# Patient Record
Sex: Male | Born: 1965
Health system: Southern US, Community
[De-identification: ages and names within clinical notes are randomized; demographics above are authoritative.]

## PROBLEM LIST (undated history)

## (undated) DIAGNOSIS — Z8481 Family history of carrier of genetic disease: Secondary | ICD-10-CM

## (undated) DIAGNOSIS — Z801 Family history of malignant neoplasm of trachea, bronchus and lung: Secondary | ICD-10-CM

## (undated) DIAGNOSIS — I10 Essential (primary) hypertension: Secondary | ICD-10-CM

## (undated) DIAGNOSIS — C801 Malignant (primary) neoplasm, unspecified: Secondary | ICD-10-CM

## (undated) DIAGNOSIS — Z800 Family history of malignant neoplasm of digestive organs: Secondary | ICD-10-CM

## (undated) DIAGNOSIS — Z83719 Family history of colon polyps, unspecified: Secondary | ICD-10-CM

## (undated) DIAGNOSIS — Z803 Family history of malignant neoplasm of breast: Secondary | ICD-10-CM

## (undated) DIAGNOSIS — M5030 Other cervical disc degeneration, unspecified cervical region: Secondary | ICD-10-CM

## (undated) DIAGNOSIS — R000 Tachycardia, unspecified: Secondary | ICD-10-CM

## (undated) DIAGNOSIS — Z8371 Family history of colonic polyps: Secondary | ICD-10-CM

## (undated) DIAGNOSIS — R51 Headache: Secondary | ICD-10-CM

## (undated) DIAGNOSIS — K5790 Diverticulosis of intestine, part unspecified, without perforation or abscess without bleeding: Secondary | ICD-10-CM

## (undated) DIAGNOSIS — Z87442 Personal history of urinary calculi: Secondary | ICD-10-CM

## (undated) DIAGNOSIS — Z860101 Personal history of adenomatous and serrated colon polyps: Secondary | ICD-10-CM

## (undated) DIAGNOSIS — G4730 Sleep apnea, unspecified: Secondary | ICD-10-CM

## (undated) DIAGNOSIS — R21 Rash and other nonspecific skin eruption: Secondary | ICD-10-CM

## (undated) DIAGNOSIS — Z86010 Personal history of colonic polyps: Secondary | ICD-10-CM

## (undated) DIAGNOSIS — K648 Other hemorrhoids: Secondary | ICD-10-CM

## (undated) DIAGNOSIS — T7840XA Allergy, unspecified, initial encounter: Secondary | ICD-10-CM

## (undated) HISTORY — DX: Personal history of colonic polyps: Z86.010

## (undated) HISTORY — DX: Sleep apnea, unspecified: G47.30

## (undated) HISTORY — PX: COLONOSCOPY: SHX174

## (undated) HISTORY — DX: Family history of colon polyps, unspecified: Z83.719

## (undated) HISTORY — DX: Other cervical disc degeneration, unspecified cervical region: M50.30

## (undated) HISTORY — DX: Essential (primary) hypertension: I10

## (undated) HISTORY — DX: Family history of carrier of genetic disease: Z84.81

## (undated) HISTORY — DX: Allergy, unspecified, initial encounter: T78.40XA

## (undated) HISTORY — DX: Malignant (primary) neoplasm, unspecified: C80.1

## (undated) HISTORY — DX: Other hemorrhoids: K64.8

## (undated) HISTORY — DX: Family history of colonic polyps: Z83.71

## (undated) HISTORY — DX: Tachycardia, unspecified: R00.0

## (undated) HISTORY — DX: Family history of malignant neoplasm of breast: Z80.3

## (undated) HISTORY — DX: Personal history of adenomatous and serrated colon polyps: Z86.0101

## (undated) HISTORY — DX: Family history of malignant neoplasm of digestive organs: Z80.0

## (undated) HISTORY — DX: Family history of malignant neoplasm of trachea, bronchus and lung: Z80.1

## (undated) HISTORY — DX: Diverticulosis of intestine, part unspecified, without perforation or abscess without bleeding: K57.90

---

## 2010-07-31 ENCOUNTER — Ambulatory Visit (HOSPITAL_COMMUNITY): Admission: RE | Admit: 2010-07-31 | Discharge: 2010-07-31 | Payer: Self-pay | Admitting: Gastroenterology

## 2010-08-06 ENCOUNTER — Encounter: Admission: RE | Admit: 2010-08-06 | Discharge: 2010-08-06 | Payer: Self-pay | Admitting: Gastroenterology

## 2010-10-30 ENCOUNTER — Encounter: Admission: RE | Admit: 2010-10-30 | Payer: Self-pay | Source: Home / Self Care | Admitting: Internal Medicine

## 2010-10-31 ENCOUNTER — Encounter
Admission: RE | Admit: 2010-10-31 | Discharge: 2010-10-31 | Payer: Self-pay | Source: Home / Self Care | Attending: Internal Medicine | Admitting: Internal Medicine

## 2010-12-19 ENCOUNTER — Other Ambulatory Visit: Payer: Self-pay | Admitting: Internal Medicine

## 2010-12-19 DIAGNOSIS — R319 Hematuria, unspecified: Secondary | ICD-10-CM

## 2010-12-22 ENCOUNTER — Ambulatory Visit
Admission: RE | Admit: 2010-12-22 | Discharge: 2010-12-22 | Disposition: A | Discharge status: Home / Self Care | Payer: Managed Care, Other (non HMO) | Source: Ambulatory Visit | Attending: Internal Medicine | Admitting: Internal Medicine

## 2010-12-22 DIAGNOSIS — R319 Hematuria, unspecified: Secondary | ICD-10-CM

## 2011-03-24 ENCOUNTER — Other Ambulatory Visit: Payer: Self-pay | Admitting: Gastroenterology

## 2011-09-14 ENCOUNTER — Other Ambulatory Visit: Payer: Self-pay | Admitting: Gastroenterology

## 2012-09-11 ENCOUNTER — Ambulatory Visit (INDEPENDENT_AMBULATORY_CARE_PROVIDER_SITE_OTHER): Payer: Managed Care, Other (non HMO) | Admitting: Family Medicine

## 2012-09-11 VITALS — BP 137/88 | HR 97 | Temp 98.4°F | Resp 16 | Ht 69.0 in | Wt 214.0 lb

## 2012-09-11 DIAGNOSIS — Z0000 Encounter for general adult medical examination without abnormal findings: Secondary | ICD-10-CM

## 2012-09-11 DIAGNOSIS — Z87891 Personal history of nicotine dependence: Secondary | ICD-10-CM

## 2012-09-11 DIAGNOSIS — Z801 Family history of malignant neoplasm of trachea, bronchus and lung: Secondary | ICD-10-CM

## 2012-09-11 NOTE — Progress Notes (Signed)
Subjective:    Patient ID: Jon King, male    DOB: [birth year], 46 y.o.   MRN: [id number]  HPI Jon King is a 46 y.o. male Here for CPE, DOT physical.   Flu shot 1 month ago. Last td in 2012.  Gabapentin for nerve pain in neck for past 2 years.  No side effects. No sedation, lightheadedness or dizziness with this medicine, no recent dose changes.   Usually followed by Dr. Nehemiah Settle.  Normal prostate exam last year.  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   Father and older sister with Lung cancer - both smokers, father deceased at 9yo.  Patient smoked 1ppd for approx 18years, quit 11 years ago.  No personal cough, night sweats or unexplained weight loss. Unknown last cxr.   Colonoscopy - 2 years ago for blood in stool - benign.  Repeat colonoscopy last year - normal.  Plan to repeat in 3 years.   Fasting today.   Review of Systems Agree with CMA note - no positive responses - 13pt ros.     Objective:   Physical Exam  Constitutional: He is oriented to person, place, and time. He appears well-developed and well-nourished.  HENT:  Head: Normocephalic and atraumatic.  Right Ear: External ear normal.  Left Ear: External ear normal.  Mouth/Throat: Oropharynx is clear and moist.  Eyes: Conjunctivae normal and EOM are normal. Pupils are equal, round, and reactive to light.  Neck: Normal range of motion. Neck supple. No thyromegaly present.  Cardiovascular: Normal rate, regular rhythm, normal heart sounds and intact distal pulses.   Pulmonary/Chest: Effort normal and breath sounds normal. No respiratory distress. He has no wheezes.  Abdominal: Soft. He exhibits no distension. There is no tenderness. Hernia confirmed negative in the right inguinal area and confirmed negative in the left inguinal area.  Musculoskeletal: Normal range of motion. He exhibits no edema and no tenderness.    Lymphadenopathy:    He has no cervical adenopathy.  Neurological: He is alert and oriented to person, place, and time. He has normal reflexes.  Skin: Skin is warm and dry.  Psychiatric: He has a normal mood and affect. His behavior is normal.        Assessment & Plan:  Jon King is a 46 y.o. male 1. Annual physical exam   2. Family history of lung cancer   3. History of tobacco use     CPE - declined labs - plans on follow up with Dr. Nehemiah Settle next year - 1st of year. Up to date on td and flu vaccine.   Dot pe - 2 year card.  See ppwk.  Trace hematuria - to follow up with Dr. Nehemiah Settle.   Discussed prostate testing - declined.   Discussed CXR given FH lung CA and prior smoker - 18pack year hx.  Declined today - will follow up with Dr. Nehemiah Settle.  Patient Instructions  Your screening urine test did pick up a trace of possible blood.  Follow up with your primary care doctor to recheck a urine test, and discuss this further.  Also follow up for other discussion on labs or necessary x rays.  Return to the clinic or go to the nearest emergency room if any of your symptoms worsen or new symptoms occur.  Keeping you healthy  Get these tests  Blood pressure- Have your blood pressure checked once a year  by your healthcare provider.  Normal blood pressure is 120/80.  Weight- Have your body mass index (BMI) calculated to screen for obesity.  BMI is a measure of body fat based on height and weight. You can also calculate your own BMI at https://www.west-esparza.com/.  Cholesterol- Have your cholesterol checked regularly starting at age 23, sooner may be necessary if you have diabetes, high blood pressure, if a family member developed heart diseases at an early age or if you smoke.   Chlamydia, HIV, and other sexual transmitted disease- Get screened each year until the age of 18 then within three months of each new sexual partner.  Diabetes- Have your blood sugar checked regularly if you have  high blood pressure, high cholesterol, a family history of diabetes or if you are overweight.  Get these vaccines  Flu shot- Every fall.  Tetanus shot- Every 10 years.  Menactra- Single dose; prevents meningitis.  Take these steps  Don't smoke- If you do smoke, ask your healthcare provider about quitting. For tips on how to quit, go to www.smokefree.gov or call 1-800-QUIT-NOW.  Be physically active- Exercise 5 days a week for at least 30 minutes.  If you are not already physically active start slow and gradually work up to 30 minutes of moderate physical activity.  Examples of moderate activity include walking briskly, mowing the yard, dancing, swimming bicycling, etc.  Eat a healthy diet- Eat a variety of healthy foods such as fruits, vegetables, low fat milk, low fat cheese, yogurt, lean meats, poultry, fish, beans, tofu, etc.  For more information on healthy eating, go to www.thenutritionsource.org  Drink alcohol in moderation- Limit alcohol intake two drinks or less a day.  Never drink and drive.  Dentist- Brush and floss teeth twice daily; visit your dentis twice a year.  Depression-Your emotional health is as important as your physical health.  If you're feeling down, losing interest in things you normally enjoy please talk with your healthcare provider.  Gun Safety- If you keep a gun in your home, keep it unloaded and with the safety lock on.  Bullets should be stored separately.  Helmet use- Always wear a helmet when riding a motorcycle, bicycle, rollerblading or skateboarding.  Safe sex- If you may be exposed to a sexually transmitted infection, use a condom  Seat belts- Seat bels can save your life; always wear one.  Smoke/Carbon Monoxide detectors- These detectors need to be installed on the appropriate level of your home.  Replace batteries at least once a year.  Skin Cancer- When out in the sun, cover up and use sunscreen SPF 15 or higher.  Violence- If anyone is  threatening or hurting you, please tell your healthcare provider.

## 2012-09-11 NOTE — Patient Instructions (Signed)
Your screening urine test did pick up a trace of possible blood.  Follow up with your primary care doctor to recheck a urine test, and discuss this further.  Also follow up for other discussion on labs or necessary x rays.  Return to the clinic or go to the nearest emergency room if any of your symptoms worsen or new symptoms occur.  Keeping you healthy  Get these tests  Blood pressure- Have your blood pressure checked once a year by your healthcare provider.  Normal blood pressure is 120/80.  Weight- Have your body mass index (BMI) calculated to screen for obesity.  BMI is a measure of body fat based on height and weight. You can also calculate your own BMI at https://www.west-esparza.com/.  Cholesterol- Have your cholesterol checked regularly starting at age 1, sooner may be necessary if you have diabetes, high blood pressure, if a family member developed heart diseases at an early age or if you smoke.   Chlamydia, HIV, and other sexual transmitted disease- Get screened each year until the age of 30 then within three months of each new sexual partner.  Diabetes- Have your blood sugar checked regularly if you have high blood pressure, high cholesterol, a family history of diabetes or if you are overweight.  Get these vaccines  Flu shot- Every fall.  Tetanus shot- Every 10 years.  Menactra- Single dose; prevents meningitis.  Take these steps  Don't smoke- If you do smoke, ask your healthcare provider about quitting. For tips on how to quit, go to www.smokefree.gov or call 1-800-QUIT-NOW.  Be physically active- Exercise 5 days a week for at least 30 minutes.  If you are not already physically active start slow and gradually work up to 30 minutes of moderate physical activity.  Examples of moderate activity include walking briskly, mowing the yard, dancing, swimming bicycling, etc.  Eat a healthy diet- Eat a variety of healthy foods such as fruits, vegetables, low fat milk, low fat cheese,  yogurt, lean meats, poultry, fish, beans, tofu, etc.  For more information on healthy eating, go to www.thenutritionsource.org  Drink alcohol in moderation- Limit alcohol intake two drinks or less a day.  Never drink and drive.  Dentist- Brush and floss teeth twice daily; visit your dentis twice a year.  Depression-Your emotional health is as important as your physical health.  If you're feeling down, losing interest in things you normally enjoy please talk with your healthcare provider.  Gun Safety- If you keep a gun in your home, keep it unloaded and with the safety lock on.  Bullets should be stored separately.  Helmet use- Always wear a helmet when riding a motorcycle, bicycle, rollerblading or skateboarding.  Safe sex- If you may be exposed to a sexually transmitted infection, use a condom  Seat belts- Seat bels can save your life; always wear one.  Smoke/Carbon Monoxide detectors- These detectors need to be installed on the appropriate level of your home.  Replace batteries at least once a year.  Skin Cancer- When out in the sun, cover up and use sunscreen SPF 15 or higher.  Violence- If anyone is threatening or hurting you, please tell your healthcare provider.

## 2012-10-26 HISTORY — PX: LITHOTRIPSY: SUR834

## 2013-03-15 ENCOUNTER — Telehealth: Payer: Self-pay | Admitting: Diagnostic Neuroimaging

## 2013-03-15 ENCOUNTER — Other Ambulatory Visit: Payer: Self-pay | Admitting: Nurse Practitioner

## 2013-03-15 NOTE — Telephone Encounter (Signed)
Patient has not been seen in over 1 year.  He will need to schedule an appt.  As well, we need to verify what Pharmacy he uses.  I called back.  Got no answer.  Left message.

## 2013-05-02 ENCOUNTER — Other Ambulatory Visit: Payer: Self-pay | Admitting: Internal Medicine

## 2013-05-02 ENCOUNTER — Inpatient Hospital Stay: Admission: RE | Admit: 2013-05-02 | Payer: Managed Care, Other (non HMO) | Source: Ambulatory Visit

## 2013-05-02 DIAGNOSIS — R109 Unspecified abdominal pain: Secondary | ICD-10-CM

## 2013-05-03 ENCOUNTER — Ambulatory Visit
Admission: RE | Admit: 2013-05-03 | Discharge: 2013-05-03 | Disposition: A | Discharge status: Home / Self Care | Payer: Managed Care, Other (non HMO) | Source: Ambulatory Visit | Attending: Internal Medicine | Admitting: Internal Medicine

## 2013-05-03 DIAGNOSIS — R109 Unspecified abdominal pain: Secondary | ICD-10-CM

## 2013-05-03 MED ORDER — IOHEXOL 300 MG/ML  SOLN
125.0000 mL | Freq: Once | INTRAMUSCULAR | Status: AC | PRN
  Administered 2013-05-03: 125 mL via INTRAVENOUS

## 2013-05-04 ENCOUNTER — Encounter (HOSPITAL_COMMUNITY): Payer: Self-pay | Admitting: *Deleted

## 2013-05-04 ENCOUNTER — Other Ambulatory Visit: Payer: Self-pay | Admitting: Urology

## 2013-05-08 ENCOUNTER — Encounter (HOSPITAL_COMMUNITY): Payer: Self-pay | Admitting: General Practice

## 2013-05-08 ENCOUNTER — Ambulatory Visit (HOSPITAL_COMMUNITY)
Admission: RE | Admit: 2013-05-08 | Discharge: 2013-05-08 | Disposition: A | Discharge status: Home / Self Care | Payer: Managed Care, Other (non HMO) | Source: Ambulatory Visit | Attending: Urology | Admitting: Urology

## 2013-05-08 ENCOUNTER — Encounter (HOSPITAL_COMMUNITY)
Admission: RE | Disposition: A | Discharge status: Home / Self Care | Payer: Self-pay | Source: Ambulatory Visit | Attending: Urology

## 2013-05-08 ENCOUNTER — Encounter (HOSPITAL_COMMUNITY): Payer: Self-pay | Admitting: Pharmacy Technician

## 2013-05-08 ENCOUNTER — Ambulatory Visit (HOSPITAL_COMMUNITY): Payer: Managed Care, Other (non HMO)

## 2013-05-08 DIAGNOSIS — N201 Calculus of ureter: Secondary | ICD-10-CM | POA: Insufficient documentation

## 2013-05-08 HISTORY — DX: Personal history of urinary calculi: Z87.442

## 2013-05-08 HISTORY — DX: Headache: R51

## 2013-05-08 SURGERY — LITHOTRIPSY, ESWL
Anesthesia: LOCAL | Laterality: Right

## 2013-05-08 MED ORDER — LEVOFLOXACIN 500 MG PO TABS
500.0000 mg | ORAL_TABLET | ORAL | Status: AC
  Administered 2013-05-08: 500 mg via ORAL
  Filled 2013-05-08: qty 1

## 2013-05-08 MED ORDER — DIAZEPAM 5 MG PO TABS
10.0000 mg | ORAL_TABLET | ORAL | Status: AC
  Administered 2013-05-08: 10 mg via ORAL
  Filled 2013-05-08: qty 2

## 2013-05-08 MED ORDER — DIPHENHYDRAMINE HCL 25 MG PO CAPS
25.0000 mg | ORAL_CAPSULE | ORAL | Status: AC
  Administered 2013-05-08: 25 mg via ORAL
  Filled 2013-05-08: qty 1

## 2013-05-08 MED ORDER — DEXTROSE-NACL 5-0.45 % IV SOLN
INTRAVENOUS | Status: DC
  Administered 2013-05-08: 125 mL/h via INTRAVENOUS

## 2013-05-08 NOTE — H&P (Signed)
  Urology History and Physical Exam  CC: right kidney stone  HPI: 47 year old male presents at this time for lithotripsy for a proximal ureteral stone on the right. He presented last week with significant pain. He has been managed with narcotics since that time. His procedure would have been done last week, but he had been on anti-inflammatories.  PMH: Past Medical History  Diagnosis Date  . Degenerative disc disease, cervical   . History of kidney stones   . Headache(784.0)     orginate from nerve thing in head    PSH: Past Surgical History  Procedure Laterality Date  . Colonoscopy  2010, 2011    Allergies: No Known Allergies  Medications: Prescriptions prior to admission  Medication Sig Dispense Refill  . gabapentin (NEURONTIN) 300 MG capsule Take 300 mg by mouth 2 (two) times daily.      Marland Kitchen ibuprofen (ADVIL,MOTRIN) 200 MG tablet Take 200 mg by mouth every 6 (six) hours as needed for pain.      Marland Kitchen oxyCODONE-acetaminophen (PERCOCET/ROXICET) 5-325 MG per tablet Take 1 tablet by mouth every 6 (six) hours as needed for pain.         Social History: History   Social History  . Marital Status: Married    Spouse Name: N/A    Number of Children: N/A  . Years of Education: N/A   Occupational History  . Not on file.   Social History Main Topics  . Smoking status: Former Smoker    Quit date: 09/11/2001  . Smokeless tobacco: Not on file  . Alcohol Use: No  . Drug Use: No  . Sexually Active: Yes   Other Topics Concern  . Not on file   Social History Narrative  . No narrative on file    Family History: Family History  Problem Relation Age of Onset  . Lung cancer Father   . Lung cancer Sister   . Asthma Paternal Grandfather     Review of Systems: Genitourinary, constitutional, skin, eye, otolaryngeal, hematologic/lymphatic, cardiovascular, pulmonary, endocrine, musculoskeletal, gastrointestinal, neurological and psychiatric system(s) were reviewed and pertinent  findings if present are noted.  Genitourinary: urinary frequency, nocturia, difficulty starting the urinary stream, hematuria and erectile dysfunction.  Gastrointestinal: nausea.  Physical Exam: @VITALS2 @ General: No acute distress.  Awake. Head:  Normocephalic.  Atraumatic. ENT:  EOMI.  Mucous membranes moist Neck:  Supple.  No lymphadenopathy. CV:  S1 present. S2 present. Regular rate. Pulmonary: Equal effort bilaterally.  Clear to auscultation bilaterally. Abdomen: Soft.  Non tender to palpation. Skin:  Normal turgor.  No visible rash. Extremity: No gross deformity of bilateral upper extremities.  No gross deformity of    bilateral lower extremities. Neurologic: Alert. Appropriate mood.  n.  Studies:  No results found for this basename: HGB, WBC, PLT,  in the last 72 hours  No results found for this basename: NA, K, CL, CO2, BUN, CREATININE, CALCIUM, MAGNESIUM, GFRNONAA, GFRAA,  in the last 72 hours   No results found for this basename: PT, INR, APTT,  in the last 72 hours   No components found with this basename: ABG,     Assessment:  6 x 9 mm proximal right ureteral stone  Plan: ESL of proximal right ureteral stone

## 2013-07-10 ENCOUNTER — Telehealth: Payer: Self-pay | Admitting: Diagnostic Neuroimaging

## 2013-07-10 MED ORDER — GABAPENTIN 300 MG PO CAPS: 300.0000 mg | ORAL_CAPSULE | Freq: Three times a day (TID) | ORAL | Status: DC

## 2013-07-10 NOTE — Telephone Encounter (Signed)
Patient has scheduled appt.  Rx sent.

## 2013-07-12 ENCOUNTER — Encounter: Payer: Self-pay | Admitting: Nurse Practitioner

## 2013-07-12 ENCOUNTER — Ambulatory Visit (INDEPENDENT_AMBULATORY_CARE_PROVIDER_SITE_OTHER): Payer: Managed Care, Other (non HMO) | Admitting: Nurse Practitioner

## 2013-07-12 VITALS — BP 137/92 | HR 91 | Ht 71.0 in | Wt 219.0 lb

## 2013-07-12 DIAGNOSIS — R51 Headache: Secondary | ICD-10-CM

## 2013-07-12 DIAGNOSIS — R519 Headache, unspecified: Secondary | ICD-10-CM | POA: Insufficient documentation

## 2013-07-12 DIAGNOSIS — Z87442 Personal history of urinary calculi: Secondary | ICD-10-CM | POA: Insufficient documentation

## 2013-07-12 DIAGNOSIS — M542 Cervicalgia: Secondary | ICD-10-CM | POA: Insufficient documentation

## 2013-07-12 MED ORDER — GABAPENTIN 300 MG PO CAPS: 300.0000 mg | ORAL_CAPSULE | Freq: Three times a day (TID) | ORAL | Status: DC

## 2013-07-12 NOTE — Progress Notes (Signed)
[facility]  PATIENT: Jon King DOB: 10/02/1966   REASON FOR VISIT: Followup for occipital neuralgia, cervicogenic headache.    HISTORY OF PRESENT ILLNESS:47 -year-old right-handed male with no significant past medical history, initially evaluated by Dr. Marjory Lies 03/17/11  for evaluation of neck pain, headache and dizziness. He remains on  Gabapentin and has responded well. These episodes have decreased in number and intensity. He had complained of numbness intermittently in the 4th and 5th fingers on the left. Nerve conduction study was normal. No new neurological complaints.   HISTORY: In Sept 2011, the patient developed right lower back pain radiating to his right lower abdomen. He also developed intermittent blood in his stool. He was also developing sweaty sensation, dizziness and lightheadedness. He was diagnosed with colon polyps and had 11 polyps resected. Ever since that time he has had continued episodes of intermittent neck pain, sweating sensation, ear pressure and headache. These fluctuate throughout the week with 1-2 exacerbations per week. He also reports left scalp sensitivity. He felt itching sensation in his ears. He was evaluated by ENT who found no specific ear patholgy. He denies nausea, vomiting, photophobia, phonophobia, vision changes.   REVIEW OF SYSTEMS: Full 14 system review of systems performed and notable only for:  Constitutional: N/A  Cardiovascular: N/A  Ear/Nose/Throat: N/A  Skin: N/A  Eyes: N/A  Respiratory: N/A  Gastroitestinal: N/A  Hematology/Lymphatic: N/A  Endocrine: N/A Musculoskeletal:N/A  Allergy/Immunology: N/A  Neurological: N/A Psychiatric: N/A   ALLERGIES: No Known Allergies  HOME MEDICATIONS: Outpatient Prescriptions Prior to Visit  Medication Sig Dispense Refill  . gabapentin (NEURONTIN) 300 MG capsule Take 1 capsule (300 mg total) by mouth 3 (three) times daily.  270 capsule  0  . ibuprofen (ADVIL,MOTRIN)  200 MG tablet Take 200 mg by mouth every 6 (six) hours as needed for pain.      Marland Kitchen oxyCODONE-acetaminophen (PERCOCET/ROXICET) 5-325 MG per tablet Take 1 tablet by mouth every 6 (six) hours as needed for pain.       No facility-administered medications prior to visit.    PAST MEDICAL HISTORY: Past Medical History  Diagnosis Date  . Degenerative disc disease, cervical   . History of kidney stones   . Headache(784.0)     Left scalp tenderness    PAST SURGICAL HISTORY: Past Surgical History  Procedure Laterality Date  . Colonoscopy  2010, 2011    FAMILY HISTORY: Family History  Problem Relation Age of Onset  . Lung cancer Father   . Lung cancer Sister   . Asthma Paternal Grandfather     SOCIAL HISTORY: History   Social History  . Marital Status: Married    Spouse Name: Cordelia Pen    Number of Children: 2  . Years of Education: 12   Occupational History  .      [employer].   Social History Main Topics  . Smoking status: Former Smoker    Quit date: 09/11/2001  . Smokeless tobacco: Never Used  . Alcohol Use: No  . Drug Use: No  . Sexual Activity: Yes   Other Topics Concern  . Not on file   Social History Narrative   Patient lives at home with his wife Cordelia Pen). Patient works full time. 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 Patient has high school education.   Right handed.   Caffeine- sodas - two daily.     PHYSICAL EXAM  Filed Vitals:   07/12/13 1321  BP: 137/92  Pulse: 91  Height: 5\' 11"  (1.803 m)  Weight: 219 lb (99.338 kg)   Body mass index is 30.56 kg/(m^2).  Generalized: Well developed, in no acute distress  Head: normocephalic and atraumatic,. Oropharynx benign  Neck: Supple, no carotid bruits  Cardiac: Regular rate rhythm, no murmur    Neurological examination   Mentation: Alert oriented to time, place, history taking. Follows all commands speech and language fluent  Cranial nerve II-XII: Pupils were equal round reactive to light  extraocular movements were full, visual field were full on confrontational test. Facial sensation and strength were normal. hearing was intact to finger rubbing bilaterally. Uvula tongue midline. head turning and shoulder shrug and were normal and symmetric.Tongue protrusion into cheek strength was normal. Motor: normal bulk and tone, full strength in the BUE, BLE, fine finger movements normal, no pronator drift. No focal weakness Sensory: normal and symmetric to light touch, pinprick, and  vibration  Coordination: finger-nose-finger, heel-to-shin bilaterally, no dysmetria Reflexes: Brachioradialis 2/2, biceps 2/2, triceps 2/2, patellar 2/2, Achilles 2/2, plantar responses were flexor bilaterally. Gait and Station: Rising up from seated position without assistance, normal stance, without trunk ataxia, moderate stride, good arm swing, smooth turning, able to perform tiptoe, and heel walking without difficulty.   DIAGNOSTIC DATA (LABS, IMAGING, TESTING) -None to review  ASSESSMENT AND PLAN  47 y.o. year old male  has a past medical history of Degenerative disc disease, cervical; History of kidney stones; and Headache(784.0). here for routine followup.  Continue gabapentin at current dose will refill Use Claritin or Allegra for allergies Followup yearly and when necessary Nilda Riggs, [facility], [facility], APRN  [facility] [address] [place], [place] [address] [phone]

## 2013-07-12 NOTE — Patient Instructions (Addendum)
Continue gabapentin at current dose will refill Use Claritin or Allegra for allergies Followup yearly and when necessary

## 2013-08-14 NOTE — Progress Notes (Signed)
I reviewed note and agree with plan.   Suanne Marker, MD 08/14/2013, 10:53 PM Certified in Neurology, Neurophysiology and Neuroimaging  [facility] [address], [address] [place], [place] [address] [phone]

## 2014-01-08 ENCOUNTER — Telehealth: Payer: Self-pay | Admitting: Nurse Practitioner

## 2014-01-08 MED ORDER — GABAPENTIN 300 MG PO CAPS: 300.0000 mg | ORAL_CAPSULE | Freq: Three times a day (TID) | ORAL | Status: DC

## 2014-01-08 NOTE — Telephone Encounter (Signed)
Rx has been sent.  I called the patient.  He is aware.  

## 2014-01-08 NOTE — Telephone Encounter (Signed)
Pt's wife Judeen Hammans called pt wants to know if he can get a refill on his gabapentin (NEURONTIN) 300 MG capsule. Pt has new insurance and would like for prescriptions request sent to [facility] on [address]. Pt would like for someone to c/b once this has been called into pharmacy.

## 2014-07-12 ENCOUNTER — Ambulatory Visit: Payer: Managed Care, Other (non HMO) | Admitting: Nurse Practitioner

## 2015-02-07 ENCOUNTER — Other Ambulatory Visit: Payer: Self-pay | Admitting: Gastroenterology

## 2015-04-26 ENCOUNTER — Other Ambulatory Visit: Payer: Self-pay | Admitting: Gastroenterology

## 2015-05-09 ENCOUNTER — Encounter (HOSPITAL_COMMUNITY): Payer: Self-pay | Admitting: *Deleted

## 2015-05-09 ENCOUNTER — Other Ambulatory Visit: Payer: Self-pay | Admitting: Gastroenterology

## 2015-05-12 NOTE — Anesthesia Preprocedure Evaluation (Addendum)
Anesthesia Evaluation  Patient identified by MRN, date of birth, ID band Patient awake    Reviewed: Allergy & Precautions, NPO status , Patient's Chart, lab work & pertinent test results  History of Anesthesia Complications Negative for: history of anesthetic complications  Airway Mallampati: II  TM Distance: >3 FB Neck ROM: Full    Dental no notable dental hx. (+) Dental Advisory Given   Pulmonary former smoker,  breath sounds clear to auscultation  Pulmonary exam normal       Cardiovascular negative cardio ROS Normal cardiovascular examRhythm:Regular Rate:Normal     Neuro/Psych  Headaches, negative psych ROS   GI/Hepatic negative GI ROS, Neg liver ROS,   Endo/Other  obesity  Renal/GU negative Renal ROS  negative genitourinary   Musculoskeletal  (+) Arthritis -, Osteoarthritis,    Abdominal   Peds negative pediatric ROS (+)  Hematology negative hematology ROS (+)   Anesthesia Other Findings   Reproductive/Obstetrics negative OB ROS                            Anesthesia Physical Anesthesia Plan  ASA: II  Anesthesia Plan: MAC   Post-op Pain Management:    Induction: Intravenous  Airway Management Planned: Nasal Cannula  Additional Equipment:   Intra-op Plan:   Post-operative Plan:   Informed Consent: I have reviewed the patients History and Physical, chart, labs and discussed the procedure including the risks, benefits and alternatives for the proposed anesthesia with the patient or authorized representative who has indicated his/her understanding and acceptance.   Dental advisory given  Plan Discussed with: CRNA  Anesthesia Plan Comments:         Anesthesia Quick Evaluation

## 2015-05-13 ENCOUNTER — Encounter (HOSPITAL_COMMUNITY)
Admission: RE | Disposition: A | Discharge status: Home / Self Care | Payer: Self-pay | Source: Ambulatory Visit | Attending: Gastroenterology

## 2015-05-13 ENCOUNTER — Ambulatory Visit (HOSPITAL_COMMUNITY): Payer: BLUE CROSS/BLUE SHIELD | Admitting: Anesthesiology

## 2015-05-13 ENCOUNTER — Encounter (HOSPITAL_COMMUNITY): Payer: Self-pay

## 2015-05-13 ENCOUNTER — Ambulatory Visit (HOSPITAL_COMMUNITY)
Admission: RE | Admit: 2015-05-13 | Discharge: 2015-05-13 | Disposition: A | Discharge status: Home / Self Care | Payer: BLUE CROSS/BLUE SHIELD | Source: Ambulatory Visit | Attending: Gastroenterology | Admitting: Gastroenterology

## 2015-05-13 DIAGNOSIS — Z87891 Personal history of nicotine dependence: Secondary | ICD-10-CM | POA: Insufficient documentation

## 2015-05-13 DIAGNOSIS — M1990 Unspecified osteoarthritis, unspecified site: Secondary | ICD-10-CM | POA: Insufficient documentation

## 2015-05-13 DIAGNOSIS — D123 Benign neoplasm of transverse colon: Secondary | ICD-10-CM | POA: Insufficient documentation

## 2015-05-13 DIAGNOSIS — K635 Polyp of colon: Secondary | ICD-10-CM | POA: Diagnosis not present

## 2015-05-13 DIAGNOSIS — J309 Allergic rhinitis, unspecified: Secondary | ICD-10-CM | POA: Diagnosis not present

## 2015-05-13 DIAGNOSIS — Z87442 Personal history of urinary calculi: Secondary | ICD-10-CM | POA: Diagnosis not present

## 2015-05-13 DIAGNOSIS — Z09 Encounter for follow-up examination after completed treatment for conditions other than malignant neoplasm: Secondary | ICD-10-CM | POA: Insufficient documentation

## 2015-05-13 DIAGNOSIS — R51 Headache: Secondary | ICD-10-CM | POA: Insufficient documentation

## 2015-05-13 HISTORY — PX: COLONOSCOPY WITH PROPOFOL: SHX5780

## 2015-05-13 HISTORY — DX: Rash and other nonspecific skin eruption: R21

## 2015-05-13 SURGERY — COLONOSCOPY WITH PROPOFOL
Anesthesia: Monitor Anesthesia Care

## 2015-05-13 MED ORDER — ONDANSETRON HCL 4 MG/2ML IJ SOLN
INTRAMUSCULAR | Status: DC | PRN
  Administered 2015-05-13: 4 mg via INTRAVENOUS

## 2015-05-13 MED ORDER — LIDOCAINE HCL (CARDIAC) 20 MG/ML IV SOLN
INTRAVENOUS | Status: AC
  Filled 2015-05-13: qty 5

## 2015-05-13 MED ORDER — PROPOFOL 10 MG/ML IV BOLUS
INTRAVENOUS | Status: AC
Start: 2015-05-13 — End: 2015-05-13
  Filled 2015-05-13: qty 20

## 2015-05-13 MED ORDER — SODIUM CHLORIDE 0.9 % IV SOLN: INTRAVENOUS | Status: DC

## 2015-05-13 MED ORDER — LIDOCAINE HCL (CARDIAC) 20 MG/ML IV SOLN
INTRAVENOUS | Status: DC | PRN
  Administered 2015-05-13: 100 mg via INTRAVENOUS

## 2015-05-13 MED ORDER — LACTATED RINGERS IV SOLN
INTRAVENOUS | Status: DC
Start: 2015-05-13 — End: 2015-05-13
  Administered 2015-05-13 (×2): via INTRAVENOUS

## 2015-05-13 MED ORDER — PROPOFOL 10 MG/ML IV BOLUS
INTRAVENOUS | Status: AC
  Filled 2015-05-13: qty 20

## 2015-05-13 MED ORDER — ONDANSETRON HCL 4 MG/2ML IJ SOLN
INTRAMUSCULAR | Status: AC
  Filled 2015-05-13: qty 2

## 2015-05-13 MED ORDER — PROPOFOL 10 MG/ML IV BOLUS
INTRAVENOUS | Status: DC | PRN
  Administered 2015-05-13 (×2): 20 mg via INTRAVENOUS
  Administered 2015-05-13: 10 mg via INTRAVENOUS

## 2015-05-13 MED ORDER — PROPOFOL INFUSION 10 MG/ML OPTIME
INTRAVENOUS | Status: DC | PRN
  Administered 2015-05-13: 160 ug/kg/min via INTRAVENOUS

## 2015-05-13 SURGICAL SUPPLY — 21 items

## 2015-05-13 NOTE — Discharge Instructions (Signed)
Colonoscopy, Care After °These instructions give you information on caring for yourself after your procedure. Your doctor may also give you more specific instructions. Call your doctor if you have any problems or questions after your procedure. °HOME CARE °· Do not drive for 24 hours. °· Do not sign important papers or use machinery for 24 hours. °· You may shower. °· You may go back to your usual activities, but go slower for the first 24 hours. °· Take rest breaks often during the first 24 hours. °· Walk around or use warm packs on your belly (abdomen) if you have belly cramping or gas. °· Drink enough fluids to keep your pee (urine) clear or pale yellow. °· Resume your normal diet. Avoid heavy or fried foods. °· Avoid drinking alcohol for 24 hours or as told by your doctor. °· Only take medicines as told by your doctor. °If a tissue sample (biopsy) was taken during the procedure:  °· Do not take aspirin or blood thinners for 7 days, or as told by your doctor. °· Do not drink alcohol for 7 days, or as told by your doctor. °· Eat soft foods for the first 24 hours. °GET HELP IF: °You still have a small amount of blood in your poop (stool) 2-3 days after the procedure. °GET HELP RIGHT AWAY IF: °· You have more than a small amount of blood in your poop. °· You see clumps of tissue (blood clots) in your poop. °· Your belly is puffy (swollen). °· You feel sick to your stomach (nauseous) or throw up (vomit). °· You have a fever. °· You have belly pain that gets worse and medicine does not help. °MAKE SURE YOU: °· Understand these instructions. °· Will watch your condition. °· Will get help right away if you are not doing well or get worse. °Document Released: 11/14/2010 Document Revised: 10/17/2013 Document Reviewed: 06/19/2013 °ExitCare® Patient Information ©2015 [employer]. This information is not intended to replace advice given to you by your health care provider. Make sure you discuss any questions you have with  your health care provider. ° °

## 2015-05-13 NOTE — Anesthesia Postprocedure Evaluation (Signed)
  Anesthesia Post-op Note  Patient: Jon King  Procedure(s) Performed: Procedure(s) (LRB): COLONOSCOPY WITH PROPOFOL (N/A)  Patient Location: PACU  Anesthesia Type: MAC  Level of Consciousness: awake and alert   Airway and Oxygen Therapy: Patient Spontanous Breathing  Post-op Pain: mild  Post-op Assessment: Post-op Vital signs reviewed, Patient's Cardiovascular Status Stable, Respiratory Function Stable, Patent Airway and No signs of Nausea or vomiting  Last Vitals:  Filed Vitals:   05/13/15 1040  BP: 120/79  Pulse: 88  Temp:   Resp: 14    Post-op Vital Signs: stable   Complications: No apparent anesthesia complications

## 2015-05-13 NOTE — H&P (Signed)
  Procedure: Surveillance colonoscopy. Adenomatous colon polyps removed colonoscopically in the past.  History: The patient is a 49 year old male born 1966-07-26. He is scheduled to undergo a surveillance colonoscopy today.  Medication allergies: None  Past medical history: Tonsillectomy. Chronic headaches. Allergic rhinitis.  Exam: The patient is alert and lying comfortably on the endoscopy stretcher. Abdomen is soft and nontender to palpation. Lungs are clear to auscultation. Cardiac exam reveals a regular rhythm.  Plan: Proceed with surveillance colonoscopy

## 2015-05-13 NOTE — Transfer of Care (Signed)
Immediate Anesthesia Transfer of Care Note  Patient: Jon King  Procedure(s) Performed: Procedure(s): COLONOSCOPY WITH PROPOFOL (N/A)  Patient Location: ENDO  Anesthesia Type:MAC  Level of Consciousness:  sedated, patient cooperative and responds to stimulation  Airway & Oxygen Therapy:Patient Spontanous Breathing and Patient connected to face mask oxgen  Post-op Assessment:  Report given to PACU RN and Post -op Vital signs reviewed and stable  Post vital signs:  Reviewed and stable  Last Vitals:  Filed Vitals:   05/13/15 0839  BP: 156/100  Pulse: 108  Temp: 36.8 C  Resp: 19    Complications: No apparent anesthesia complications

## 2015-05-13 NOTE — Op Note (Signed)
Procedure: Surveillance colonoscopy. Adenomatous colon polyps removed colonoscopically in the past.  Endoscopist: Earle Gell  Premedication: Propofol administered by anesthesia  Procedure: The patient was placed in the left lateral decubitus position. Anal inspection and digital rectal exam were normal. The Pentax pediatric colonoscope was introduced into the rectum and advanced to the cecum. A normal-appearing appendiceal orifice was identified. A normal-appearing ileocecal valve was intubated and the terminal ileum inspected. Colonic preparation for the exam today was good. Withdrawal time was 38 minutes  Rectum. Normal. Retroflexed view of the distal rectum was normal  Sigmoid colon. From the distal sigmoid colon, a 1 cm sessile serrated appearing polyp was removed with the electrocautery snare. The polypectomy site was closed with endoclips. From the proximal sigmoid colon, a 5 mm sessile polyp was removed with the cold snare  Descending colon. Normal  Splenic flexure. Normal  Transverse colon. From the proximal transverse colon, a 5 mm sessile polyp and a 3 mm sessile polyp was removed with the cold biopsy forceps.  Hepatic flexure. Normal  Ascending colon. Normal  Cecum and ileocecal valve. Normal  Terminal ileum. Normal  Assessment: Two small polyps were removed from the proximal transverse colon, a small polyp was removed from the proximal sigmoid colon, and a 1 cm sessile serrated appearing polyp was removed from the distal sigmoid colon followed by Endo Clip closure of the polypectomy site to prevent bleeding.  Recommendation: Review pathology report to determine when a repeat colonoscopy should be performed.

## 2015-05-16 ENCOUNTER — Encounter (HOSPITAL_COMMUNITY): Payer: Self-pay | Admitting: Gastroenterology

## 2016-09-04 DIAGNOSIS — R030 Elevated blood-pressure reading, without diagnosis of hypertension: Secondary | ICD-10-CM | POA: Diagnosis not present

## 2016-09-04 DIAGNOSIS — Z23 Encounter for immunization: Secondary | ICD-10-CM | POA: Diagnosis not present

## 2016-09-04 DIAGNOSIS — E781 Pure hyperglyceridemia: Secondary | ICD-10-CM | POA: Diagnosis not present

## 2016-09-08 DIAGNOSIS — B078 Other viral warts: Secondary | ICD-10-CM | POA: Diagnosis not present

## 2016-09-08 DIAGNOSIS — D485 Neoplasm of uncertain behavior of skin: Secondary | ICD-10-CM | POA: Diagnosis not present

## 2016-09-08 DIAGNOSIS — D225 Melanocytic nevi of trunk: Secondary | ICD-10-CM | POA: Diagnosis not present

## 2016-09-08 DIAGNOSIS — I781 Nevus, non-neoplastic: Secondary | ICD-10-CM | POA: Diagnosis not present

## 2016-09-08 DIAGNOSIS — L821 Other seborrheic keratosis: Secondary | ICD-10-CM | POA: Diagnosis not present

## 2016-09-08 DIAGNOSIS — Z1283 Encounter for screening for malignant neoplasm of skin: Secondary | ICD-10-CM | POA: Diagnosis not present

## 2016-09-08 DIAGNOSIS — D2261 Melanocytic nevi of right upper limb, including shoulder: Secondary | ICD-10-CM | POA: Diagnosis not present

## 2016-09-08 DIAGNOSIS — D034 Melanoma in situ of scalp and neck: Secondary | ICD-10-CM | POA: Diagnosis not present

## 2016-09-15 DIAGNOSIS — I10 Essential (primary) hypertension: Secondary | ICD-10-CM | POA: Diagnosis not present

## 2016-09-16 DIAGNOSIS — C434 Malignant melanoma of scalp and neck: Secondary | ICD-10-CM | POA: Diagnosis not present

## 2016-09-16 DIAGNOSIS — D034 Melanoma in situ of scalp and neck: Secondary | ICD-10-CM | POA: Diagnosis not present

## 2016-10-06 DIAGNOSIS — Z125 Encounter for screening for malignant neoplasm of prostate: Secondary | ICD-10-CM | POA: Diagnosis not present

## 2016-10-06 DIAGNOSIS — Z0000 Encounter for general adult medical examination without abnormal findings: Secondary | ICD-10-CM | POA: Diagnosis not present

## 2016-10-06 DIAGNOSIS — I10 Essential (primary) hypertension: Secondary | ICD-10-CM | POA: Diagnosis not present

## 2016-10-16 DIAGNOSIS — J069 Acute upper respiratory infection, unspecified: Secondary | ICD-10-CM | POA: Diagnosis not present

## 2016-12-07 DIAGNOSIS — I10 Essential (primary) hypertension: Secondary | ICD-10-CM | POA: Diagnosis not present

## 2016-12-07 DIAGNOSIS — R197 Diarrhea, unspecified: Secondary | ICD-10-CM | POA: Diagnosis not present

## 2016-12-08 DIAGNOSIS — Z1283 Encounter for screening for malignant neoplasm of skin: Secondary | ICD-10-CM | POA: Diagnosis not present

## 2016-12-08 DIAGNOSIS — Z08 Encounter for follow-up examination after completed treatment for malignant neoplasm: Secondary | ICD-10-CM | POA: Diagnosis not present

## 2016-12-08 DIAGNOSIS — Z85820 Personal history of malignant melanoma of skin: Secondary | ICD-10-CM | POA: Diagnosis not present

## 2017-03-09 DIAGNOSIS — Z1283 Encounter for screening for malignant neoplasm of skin: Secondary | ICD-10-CM | POA: Diagnosis not present

## 2017-03-09 DIAGNOSIS — Z85820 Personal history of malignant melanoma of skin: Secondary | ICD-10-CM | POA: Diagnosis not present

## 2017-03-09 DIAGNOSIS — L570 Actinic keratosis: Secondary | ICD-10-CM | POA: Diagnosis not present

## 2017-03-09 DIAGNOSIS — D485 Neoplasm of uncertain behavior of skin: Secondary | ICD-10-CM | POA: Diagnosis not present

## 2017-03-09 DIAGNOSIS — Z08 Encounter for follow-up examination after completed treatment for malignant neoplasm: Secondary | ICD-10-CM | POA: Diagnosis not present

## 2017-03-09 DIAGNOSIS — D225 Melanocytic nevi of trunk: Secondary | ICD-10-CM | POA: Diagnosis not present

## 2017-03-09 DIAGNOSIS — X32XXXA Exposure to sunlight, initial encounter: Secondary | ICD-10-CM | POA: Diagnosis not present

## 2017-03-23 DIAGNOSIS — D485 Neoplasm of uncertain behavior of skin: Secondary | ICD-10-CM | POA: Diagnosis not present

## 2017-03-23 DIAGNOSIS — L988 Other specified disorders of the skin and subcutaneous tissue: Secondary | ICD-10-CM | POA: Diagnosis not present

## 2017-06-08 DIAGNOSIS — L905 Scar conditions and fibrosis of skin: Secondary | ICD-10-CM | POA: Diagnosis not present

## 2017-06-08 DIAGNOSIS — Z08 Encounter for follow-up examination after completed treatment for malignant neoplasm: Secondary | ICD-10-CM | POA: Diagnosis not present

## 2017-06-08 DIAGNOSIS — Z85820 Personal history of malignant melanoma of skin: Secondary | ICD-10-CM | POA: Diagnosis not present

## 2017-06-08 DIAGNOSIS — Z1283 Encounter for screening for malignant neoplasm of skin: Secondary | ICD-10-CM | POA: Diagnosis not present

## 2017-06-08 DIAGNOSIS — L821 Other seborrheic keratosis: Secondary | ICD-10-CM | POA: Diagnosis not present

## 2017-09-07 DIAGNOSIS — Z1283 Encounter for screening for malignant neoplasm of skin: Secondary | ICD-10-CM | POA: Diagnosis not present

## 2017-09-07 DIAGNOSIS — Z85820 Personal history of malignant melanoma of skin: Secondary | ICD-10-CM | POA: Diagnosis not present

## 2017-09-07 DIAGNOSIS — Z08 Encounter for follow-up examination after completed treatment for malignant neoplasm: Secondary | ICD-10-CM | POA: Diagnosis not present

## 2017-09-07 DIAGNOSIS — L821 Other seborrheic keratosis: Secondary | ICD-10-CM | POA: Diagnosis not present

## 2017-10-21 DIAGNOSIS — Z0000 Encounter for general adult medical examination without abnormal findings: Secondary | ICD-10-CM | POA: Diagnosis not present

## 2017-10-21 DIAGNOSIS — Z23 Encounter for immunization: Secondary | ICD-10-CM | POA: Diagnosis not present

## 2017-10-21 DIAGNOSIS — Z13220 Encounter for screening for lipoid disorders: Secondary | ICD-10-CM | POA: Diagnosis not present

## 2017-10-21 DIAGNOSIS — Z125 Encounter for screening for malignant neoplasm of prostate: Secondary | ICD-10-CM | POA: Diagnosis not present

## 2017-12-14 DIAGNOSIS — D225 Melanocytic nevi of trunk: Secondary | ICD-10-CM | POA: Diagnosis not present

## 2017-12-14 DIAGNOSIS — L821 Other seborrheic keratosis: Secondary | ICD-10-CM | POA: Diagnosis not present

## 2017-12-14 DIAGNOSIS — Z1283 Encounter for screening for malignant neoplasm of skin: Secondary | ICD-10-CM | POA: Diagnosis not present

## 2017-12-14 DIAGNOSIS — D2271 Melanocytic nevi of right lower limb, including hip: Secondary | ICD-10-CM | POA: Diagnosis not present

## 2017-12-14 DIAGNOSIS — Z08 Encounter for follow-up examination after completed treatment for malignant neoplasm: Secondary | ICD-10-CM | POA: Diagnosis not present

## 2017-12-14 DIAGNOSIS — D485 Neoplasm of uncertain behavior of skin: Secondary | ICD-10-CM | POA: Diagnosis not present

## 2017-12-14 DIAGNOSIS — Z85820 Personal history of malignant melanoma of skin: Secondary | ICD-10-CM | POA: Diagnosis not present

## 2018-01-04 DIAGNOSIS — D225 Melanocytic nevi of trunk: Secondary | ICD-10-CM | POA: Diagnosis not present

## 2018-01-04 DIAGNOSIS — D485 Neoplasm of uncertain behavior of skin: Secondary | ICD-10-CM | POA: Diagnosis not present

## 2018-01-04 DIAGNOSIS — L98429 Non-pressure chronic ulcer of back with unspecified severity: Secondary | ICD-10-CM | POA: Diagnosis not present

## 2018-03-08 DIAGNOSIS — D485 Neoplasm of uncertain behavior of skin: Secondary | ICD-10-CM | POA: Diagnosis not present

## 2018-03-08 DIAGNOSIS — D225 Melanocytic nevi of trunk: Secondary | ICD-10-CM | POA: Diagnosis not present

## 2018-03-22 DIAGNOSIS — D485 Neoplasm of uncertain behavior of skin: Secondary | ICD-10-CM | POA: Diagnosis not present

## 2018-03-22 DIAGNOSIS — L988 Other specified disorders of the skin and subcutaneous tissue: Secondary | ICD-10-CM | POA: Diagnosis not present

## 2018-06-07 DIAGNOSIS — Z1283 Encounter for screening for malignant neoplasm of skin: Secondary | ICD-10-CM | POA: Diagnosis not present

## 2018-06-07 DIAGNOSIS — Z85820 Personal history of malignant melanoma of skin: Secondary | ICD-10-CM | POA: Diagnosis not present

## 2018-06-07 DIAGNOSIS — D485 Neoplasm of uncertain behavior of skin: Secondary | ICD-10-CM | POA: Diagnosis not present

## 2018-06-07 DIAGNOSIS — D2271 Melanocytic nevi of right lower limb, including hip: Secondary | ICD-10-CM | POA: Diagnosis not present

## 2018-06-07 DIAGNOSIS — Z08 Encounter for follow-up examination after completed treatment for malignant neoplasm: Secondary | ICD-10-CM | POA: Diagnosis not present

## 2018-06-07 DIAGNOSIS — L821 Other seborrheic keratosis: Secondary | ICD-10-CM | POA: Diagnosis not present

## 2018-08-15 ENCOUNTER — Telehealth: Payer: Self-pay | Admitting: Internal Medicine

## 2018-08-15 NOTE — Telephone Encounter (Signed)
Copied from [place] [id number]. Topic: Appointment Scheduling - Scheduling Inquiry for Clinic >> Aug 15, 2018 12:04 PM Antonieta Iba C wrote: Reason for CRM: current pt's of Dr. Alain Marion, Pilar Plate and Worthy Flank would like to know if PCP would take on their son as a pt?   Please advise/assist further.   CB: [phone]

## 2018-08-15 NOTE — Telephone Encounter (Signed)
Please advise 

## 2018-08-16 NOTE — Telephone Encounter (Signed)
Ok Thx 

## 2018-08-16 NOTE — Telephone Encounter (Signed)
Patient's parents came by the office. They have been informed, he will call back and set up an appointment.

## 2018-09-27 ENCOUNTER — Encounter (INDEPENDENT_AMBULATORY_CARE_PROVIDER_SITE_OTHER): Payer: Self-pay

## 2018-09-27 ENCOUNTER — Ambulatory Visit (INDEPENDENT_AMBULATORY_CARE_PROVIDER_SITE_OTHER): Payer: BLUE CROSS/BLUE SHIELD | Admitting: Internal Medicine

## 2018-09-27 ENCOUNTER — Other Ambulatory Visit (INDEPENDENT_AMBULATORY_CARE_PROVIDER_SITE_OTHER): Payer: BLUE CROSS/BLUE SHIELD

## 2018-09-27 ENCOUNTER — Encounter: Payer: Self-pay | Admitting: Internal Medicine

## 2018-09-27 VITALS — BP 118/78 | HR 118 | Temp 98.0°F | Ht 71.0 in | Wt 223.0 lb

## 2018-09-27 DIAGNOSIS — R079 Chest pain, unspecified: Secondary | ICD-10-CM | POA: Diagnosis not present

## 2018-09-27 DIAGNOSIS — R000 Tachycardia, unspecified: Secondary | ICD-10-CM

## 2018-09-27 DIAGNOSIS — R011 Cardiac murmur, unspecified: Secondary | ICD-10-CM

## 2018-09-27 DIAGNOSIS — D126 Benign neoplasm of colon, unspecified: Secondary | ICD-10-CM

## 2018-09-27 DIAGNOSIS — K635 Polyp of colon: Secondary | ICD-10-CM | POA: Insufficient documentation

## 2018-09-27 DIAGNOSIS — Z0000 Encounter for general adult medical examination without abnormal findings: Secondary | ICD-10-CM

## 2018-09-27 DIAGNOSIS — L910 Hypertrophic scar: Secondary | ICD-10-CM

## 2018-09-27 DIAGNOSIS — Z87442 Personal history of urinary calculi: Secondary | ICD-10-CM

## 2018-09-27 DIAGNOSIS — E785 Hyperlipidemia, unspecified: Secondary | ICD-10-CM

## 2018-09-27 LAB — HEPATIC FUNCTION PANEL
ALT: 21 U/L (ref 0–53)
AST: 13 U/L (ref 0–37)
Albumin: 4.7 g/dL (ref 3.5–5.2)
Alkaline Phosphatase: 63 U/L (ref 39–117)
Bilirubin, Direct: 0.1 mg/dL (ref 0.0–0.3)
Total Bilirubin: 0.6 mg/dL (ref 0.2–1.2)
Total Protein: 7.2 g/dL (ref 6.0–8.3)

## 2018-09-27 LAB — CBC WITH DIFFERENTIAL/PLATELET
Basophils Absolute: 0.1 10*3/uL (ref 0.0–0.1)
Basophils Relative: 1 % (ref 0.0–3.0)
Eosinophils Absolute: 0.2 10*3/uL (ref 0.0–0.7)
Eosinophils Relative: 1.8 % (ref 0.0–5.0)
HCT: 45.9 % (ref 39.0–52.0)
Hemoglobin: 16 g/dL (ref 13.0–17.0)
Lymphocytes Relative: 23.9 % (ref 12.0–46.0)
Lymphs Abs: 2.3 10*3/uL (ref 0.7–4.0)
MCHC: 34.8 g/dL (ref 30.0–36.0)
MCV: 84.3 fl (ref 78.0–100.0)
Monocytes Absolute: 1.1 10*3/uL — ABNORMAL HIGH (ref 0.1–1.0)
Monocytes Relative: 11.7 % (ref 3.0–12.0)
Neutro Abs: 5.9 10*3/uL (ref 1.4–7.7)
Neutrophils Relative %: 61.6 % (ref 43.0–77.0)
Platelets: 249 10*3/uL (ref 150.0–400.0)
RBC: 5.44 Mil/uL (ref 4.22–5.81)
RDW: 13.6 % (ref 11.5–15.5)
WBC: 9.6 10*3/uL (ref 4.0–10.5)

## 2018-09-27 LAB — URINALYSIS
Bilirubin Urine: NEGATIVE
Ketones, ur: NEGATIVE
Leukocytes, UA: NEGATIVE
Nitrite: NEGATIVE
Specific Gravity, Urine: >=1.03 — AB (ref 1.000–1.030)
Total Protein, Urine: NEGATIVE
Urine Glucose: NEGATIVE
Urobilinogen, UA: 0.2 (ref 0.0–1.0)
pH: 5 (ref 5.0–8.0)

## 2018-09-27 LAB — BASIC METABOLIC PANEL
BUN: 20 mg/dL (ref 6–23)
CO2: 26 mEq/L (ref 19–32)
Calcium: 10.3 mg/dL (ref 8.4–10.5)
Chloride: 103 mEq/L (ref 96–112)
Creatinine, Ser: 0.87 mg/dL (ref 0.40–1.50)
GFR: 97.79 mL/min (ref >60.00)
Glucose, Bld: 95 mg/dL (ref 70–99)
Potassium: 3.9 mEq/L (ref 3.5–5.1)
Sodium: 139 mEq/L (ref 135–145)

## 2018-09-27 LAB — LIPID PANEL
Cholesterol: 219 mg/dL — ABNORMAL HIGH (ref 0–200)
HDL: 36.8 mg/dL — ABNORMAL LOW (ref >39.00)
Total CHOL/HDL Ratio: 6
Triglycerides: 532 mg/dL — ABNORMAL HIGH (ref 0.0–149.0)

## 2018-09-27 LAB — PSA: PSA: 0.46 ng/mL (ref 0.10–4.00)

## 2018-09-27 LAB — TSH: TSH: 2.1 u[IU]/mL (ref 0.35–4.50)

## 2018-09-27 LAB — LDL CHOLESTEROL, DIRECT: Direct LDL: 120 mg/dL

## 2018-09-27 NOTE — Assessment & Plan Note (Signed)
Post skin bx scars

## 2018-09-27 NOTE — Progress Notes (Signed)
Subjective:  Patient ID: Jon King, male    DOB: 1966/01/12  Age: 52 y.o. MRN: [id number]  CC: No chief complaint on file.   HPI Jon King presents for a new pt visit - well exam F/u HTN, colon polyps - due colon 2021   Outpatient Medications Prior to Visit  Medication Sig Dispense Refill  . lisinopril-hydrochlorothiazide (PRINZIDE,ZESTORETIC) 10-12.5 MG tablet Take 1 tablet by mouth daily.  3  . Multiple Vitamin (MULTIVITAMIN) tablet Take 1 tablet by mouth daily.    Marland Kitchen gabapentin (NEURONTIN) 300 MG capsule Take 1 capsule (300 mg total) by mouth 3 (three) times daily. (Patient not taking: Reported on 04/25/2015) 270 capsule 1   No facility-administered medications prior to visit.     ROS: Review of Systems  Constitutional: Negative for appetite change, fatigue and unexpected weight change.  HENT: Negative for congestion, nosebleeds, sneezing, sore throat and trouble swallowing.   Eyes: Negative for itching and visual disturbance.  Respiratory: Negative for cough.   Cardiovascular: Negative for chest pain, palpitations and leg swelling.  Gastrointestinal: Negative for abdominal distention, blood in stool, diarrhea and nausea.  Genitourinary: Negative for frequency and hematuria.  Musculoskeletal: Negative for back pain, gait problem, joint swelling and neck pain.  Skin: Negative for rash.  Neurological: Negative for dizziness, tremors, speech difficulty and weakness.  Psychiatric/Behavioral: Negative for agitation, dysphoric mood and sleep disturbance. The patient is not nervous/anxious.     Objective:  BP 118/78 (BP Location: Left Arm, Patient Position: Sitting, Cuff Size: Normal)   Pulse (!) 118   Temp 98 F (36.7 C) (Oral)   Ht 5\' 11"  (1.803 m)   Wt 223 lb (101.2 kg)   SpO2 98%   BMI 31.10 kg/m   BP Readings from Last 3 Encounters:  09/27/18 118/78  05/13/15 120/79  07/12/13 (!) 137/92    Wt Readings from Last 3 Encounters:  09/27/18 223 lb (101.2 kg)    05/13/15 230 lb (104.3 kg)  07/12/13 219 lb (99.3 kg)    Physical Exam  Constitutional: He is oriented to person, place, and time. He appears well-developed and well-nourished. No distress.  HENT:  Head: Normocephalic and atraumatic.  Right Ear: External ear normal.  Left Ear: External ear normal.  Nose: Nose normal.  Mouth/Throat: Oropharynx is clear and moist. No oropharyngeal exudate.  Eyes: Pupils are equal, round, and reactive to light. Conjunctivae and EOM are normal. Right eye exhibits no discharge. Left eye exhibits no discharge. No scleral icterus.  Neck: Normal range of motion. Neck supple. No JVD present. No tracheal deviation present. No thyromegaly present.  Cardiovascular: Normal rate, regular rhythm, normal heart sounds and intact distal pulses. Exam reveals no gallop and no friction rub.  No murmur heard. Pulmonary/Chest: Effort normal and breath sounds normal. No stridor. No respiratory distress. He has no wheezes. He has no rales. He exhibits no tenderness.  Abdominal: Soft. Bowel sounds are normal. He exhibits no distension and no mass. There is no tenderness. There is no rebound and no guarding.  Genitourinary: Rectum normal, prostate normal and penis normal. Rectal exam shows guaiac negative stool. No penile tenderness.  Musculoskeletal: Normal range of motion. He exhibits no edema or tenderness.  Lymphadenopathy:    He has no cervical adenopathy.  Neurological: He is alert and oriented to person, place, and time. He has normal reflexes. He displays normal reflexes. No cranial nerve deficit. He exhibits normal muscle tone. Coordination normal.  Skin: Skin is warm and dry. No  rash noted. He is not diaphoretic. No erythema. No pallor.  Psychiatric: He has a normal mood and affect. His behavior is normal. Judgment and thought content normal.  keloid scars from shave bx's  Procedure: EKG Indication: chest pain Impression: S tachy. No acute changes.  No results found  for: WBC, HGB, HCT, PLT, GLUCOSE, CHOL, TRIG, HDL, LDLDIRECT, LDLCALC, ALT, AST, NA, K, CL, CREATININE, BUN, CO2, TSH, PSA, INR, GLUF, HGBA1C, MICROALBUR  No results found.  Assessment & Plan:   There are no diagnoses linked to this encounter.   No orders of the defined types were placed in this encounter.    Follow-up: No follow-ups on file.  Walker Kehr, MD

## 2018-09-27 NOTE — Assessment & Plan Note (Signed)
No relapse 

## 2018-09-27 NOTE — Patient Instructions (Signed)
Shingrix vaccine   Cardiac CT calcium scoring test $150   Computed tomography, more commonly known as a CT or CAT scan, is a diagnostic medical imaging test. Like traditional x-rays, it produces multiple images or pictures of the inside of the body. The cross-sectional images generated during a CT scan can be reformatted in multiple planes. They can even generate three-dimensional images. These images can be viewed on a computer monitor, printed on film or by a 3D printer, or transferred to a CD or DVD. CT images of internal organs, bones, soft tissue and blood vessels provide greater detail than traditional x-rays, particularly of soft tissues and blood vessels. A cardiac CT scan for coronary calcium is a non-invasive way of obtaining information about the presence, location and extent of calcified plaque in the coronary arteries-the vessels that supply oxygen-containing blood to the heart muscle. Calcified plaque results when there is a build-up of fat and other substances under the inner layer of the artery. This material can calcify which signals the presence of atherosclerosis, a disease of the vessel wall, also called coronary artery disease (CAD). People with this disease have an increased risk for heart attacks. In addition, over time, progression of plaque build up (CAD) can narrow the arteries or even close off blood flow to the heart. The result may be chest pain, sometimes called "angina," or a heart attack. Because calcium is a marker of CAD, the amount of calcium detected on a cardiac CT scan is a helpful prognostic tool. The findings on cardiac CT are expressed as a calcium score. Another name for this test is coronary artery calcium scoring.  What are some common uses of the procedure? The goal of cardiac CT scan for calcium scoring is to determine if CAD is present and to what extent, even if there are no symptoms. It is a screening study that may be recommended by a physician for  patients with risk factors for CAD but no clinical symptoms. The major risk factors for CAD are: . high blood cholesterol levels  . family history of heart attacks  . diabetes  . high blood pressure  . cigarette smoking  . overweight or obese  . physical inactivity   A negative cardiac CT scan for calcium scoring shows no calcification within the coronary arteries. This suggests that CAD is absent or so minimal it cannot be seen by this technique. The chance of having a heart attack over the next two to five years is very low under these circumstances. A positive test means that CAD is present, regardless of whether or not the patient is experiencing any symptoms. The amount of calcification-expressed as the calcium score-may help to predict the likelihood of a myocardial infarction (heart attack) in the coming years and helps your medical doctor or cardiologist decide whether the patient may need to take preventive medicine or undertake other measures such as diet and exercise to lower the risk for heart attack. The extent of CAD is graded according to your calcium score:  Calcium Score  Presence of CAD  0 No evidence of CAD   1-10 Minimal evidence of CAD  11-100 Mild evidence of CAD  101-400 Moderate evidence of CAD  Over 400 Extensive evidence of CAD    

## 2018-09-27 NOTE — Assessment & Plan Note (Addendum)
We discussed age appropriate health related issues, including available/recomended screening tests and vaccinations. We discussed a need for adhering to healthy diet and exercise. Labs were ordered to be later reviewed . All questions were answered. Shingrix advised CT ca scoring discussed and ordered

## 2018-09-27 NOTE — Assessment & Plan Note (Signed)
Last colon 2016 Dr Wynetta Emery, due in 2021

## 2018-09-27 NOTE — Assessment & Plan Note (Signed)
ECHO

## 2018-10-04 DIAGNOSIS — Z08 Encounter for follow-up examination after completed treatment for malignant neoplasm: Secondary | ICD-10-CM | POA: Diagnosis not present

## 2018-10-04 DIAGNOSIS — D225 Melanocytic nevi of trunk: Secondary | ICD-10-CM | POA: Diagnosis not present

## 2018-10-04 DIAGNOSIS — Z1283 Encounter for screening for malignant neoplasm of skin: Secondary | ICD-10-CM | POA: Diagnosis not present

## 2018-10-04 DIAGNOSIS — D1801 Hemangioma of skin and subcutaneous tissue: Secondary | ICD-10-CM | POA: Diagnosis not present

## 2018-10-04 DIAGNOSIS — Z85820 Personal history of malignant melanoma of skin: Secondary | ICD-10-CM | POA: Diagnosis not present

## 2018-10-04 DIAGNOSIS — D485 Neoplasm of uncertain behavior of skin: Secondary | ICD-10-CM | POA: Diagnosis not present

## 2018-10-24 ENCOUNTER — Ambulatory Visit (INDEPENDENT_AMBULATORY_CARE_PROVIDER_SITE_OTHER)
Admission: RE | Admit: 2018-10-24 | Discharge: 2018-10-24 | Disposition: A | Discharge status: Home / Self Care | Payer: Self-pay | Source: Ambulatory Visit | Attending: Internal Medicine | Admitting: Internal Medicine

## 2018-10-24 DIAGNOSIS — E785 Hyperlipidemia, unspecified: Secondary | ICD-10-CM

## 2018-12-28 ENCOUNTER — Ambulatory Visit (INDEPENDENT_AMBULATORY_CARE_PROVIDER_SITE_OTHER): Payer: BLUE CROSS/BLUE SHIELD | Admitting: Internal Medicine

## 2018-12-28 ENCOUNTER — Encounter: Payer: Self-pay | Admitting: Internal Medicine

## 2018-12-28 DIAGNOSIS — L910 Hypertrophic scar: Secondary | ICD-10-CM | POA: Diagnosis not present

## 2018-12-28 DIAGNOSIS — E781 Pure hyperglyceridemia: Secondary | ICD-10-CM

## 2018-12-28 DIAGNOSIS — R0683 Snoring: Secondary | ICD-10-CM

## 2018-12-28 MED ORDER — ICOSAPENT ETHYL 1 G PO CAPS: 2.0000 | ORAL_CAPSULE | Freq: Two times a day (BID) | ORAL | 11 refills | Status: DC

## 2018-12-28 MED ORDER — LISINOPRIL-HYDROCHLOROTHIAZIDE 10-12.5 MG PO TABS: 1.0000 | ORAL_TABLET | Freq: Every day | ORAL | 3 refills | Status: DC

## 2018-12-28 NOTE — Assessment & Plan Note (Signed)
The scars are getting better per patient

## 2018-12-28 NOTE — Assessment & Plan Note (Signed)
Vascepa Diet Wt loss

## 2018-12-28 NOTE — Assessment & Plan Note (Signed)
Pulm ref

## 2018-12-28 NOTE — Progress Notes (Signed)
Subjective:  Patient ID: Jon King, male    DOB: May 04, 1966  Age: 53 y.o. MRN: [id number]  CC: No chief complaint on file.   HPI Jon King presents for elevated TG C/o poss OSA.  Complains of snoring F/u wt gain  Outpatient Medications Prior to Visit  Medication Sig Dispense Refill  . lisinopril-hydrochlorothiazide (PRINZIDE,ZESTORETIC) 10-12.5 MG tablet Take 1 tablet by mouth daily.  3  . Multiple Vitamin (MULTIVITAMIN) tablet Take 1 tablet by mouth daily.     No facility-administered medications prior to visit.     ROS: Review of Systems  Constitutional: Negative for appetite change, fatigue and unexpected weight change.  HENT: Negative for congestion, nosebleeds, sneezing, sore throat and trouble swallowing.   Eyes: Negative for itching and visual disturbance.  Respiratory: Negative for cough.   Cardiovascular: Negative for chest pain, palpitations and leg swelling.  Gastrointestinal: Negative for abdominal distention, blood in stool, diarrhea and nausea.  Genitourinary: Negative for frequency and hematuria.  Musculoskeletal: Negative for back pain, gait problem, joint swelling and neck pain.  Skin: Negative for rash.  Neurological: Negative for dizziness, tremors, speech difficulty and weakness.  Psychiatric/Behavioral: Negative for agitation, dysphoric mood and sleep disturbance. The patient is not nervous/anxious.     Objective:  BP 124/80 (BP Location: Left Arm, Patient Position: Sitting, Cuff Size: Large)   Pulse 93   Temp 98.2 F (36.8 C) (Oral)   Ht 5\' 11"  (1.803 m)   Wt 224 lb (101.6 kg)   SpO2 98%   BMI 31.24 kg/m   BP Readings from Last 3 Encounters:  12/28/18 124/80  09/27/18 118/78  05/13/15 120/79    Wt Readings from Last 3 Encounters:  12/28/18 224 lb (101.6 kg)  09/27/18 223 lb (101.2 kg)  05/13/15 230 lb (104.3 kg)    Physical Exam Constitutional:      General: He is not in acute distress.    Appearance: He is well-developed.     Comments: NAD  Eyes:     Conjunctiva/sclera: Conjunctivae normal.     Pupils: Pupils are equal, round, and reactive to light.  Neck:     Musculoskeletal: Normal range of motion.     Thyroid: No thyromegaly.     Vascular: No JVD.  Cardiovascular:     Rate and Rhythm: Normal rate and regular rhythm.     Heart sounds: Normal heart sounds. No murmur. No friction rub. No gallop.   Pulmonary:     Effort: Pulmonary effort is normal. No respiratory distress.     Breath sounds: Normal breath sounds. No wheezing or rales.  Chest:     Chest wall: No tenderness.  Abdominal:     General: Bowel sounds are normal. There is no distension.     Palpations: Abdomen is soft. There is no mass.     Tenderness: There is no abdominal tenderness. There is no guarding or rebound.  Musculoskeletal: Normal range of motion.        General: No tenderness.  Lymphadenopathy:     Cervical: No cervical adenopathy.  Skin:    General: Skin is warm and dry.     Findings: No rash.  Neurological:     Mental Status: He is alert and oriented to person, place, and time.     Cranial Nerves: No cranial nerve deficit.     Motor: No abnormal muscle tone.     Coordination: Coordination normal.     Gait: Gait normal.     Deep Tendon  Reflexes: Reflexes are normal and symmetric.  Psychiatric:        Behavior: Behavior normal.        Thought Content: Thought content normal.        Judgment: Judgment normal.   Obese  Lab Results  Component Value Date   WBC 9.6 09/27/2018   HGB 16.0 09/27/2018   HCT 45.9 09/27/2018   PLT 249.0 09/27/2018   GLUCOSE 95 09/27/2018   CHOL 219 (H) 09/27/2018   TRIG (H) 09/27/2018    532.0 Triglyceride is over 400; calculations on Lipids are invalid.   HDL 36.80 (L) 09/27/2018   LDLDIRECT 120.0 09/27/2018   ALT 21 09/27/2018   AST 13 09/27/2018   NA 139 09/27/2018   K 3.9 09/27/2018   CL 103 09/27/2018   CREATININE 0.87 09/27/2018   BUN 20 09/27/2018   CO2 26 09/27/2018   TSH  2.10 09/27/2018   PSA 0.46 09/27/2018    Ct Cardiac Scoring  Addendum Date: 10/24/2018   ADDENDUM REPORT: 10/24/2018 13:57 CLINICAL DATA:  Risk stratification EXAM: Coronary Calcium Score TECHNIQUE: The patient was scanned on a Siemens Somatom 64 slice scanner. Axial non-contrast 3 mm slices were carried out through the heart. The data set was analyzed on a dedicated work station and scored using the [place]. FINDINGS: Non-cardiac: See separate report from [facility]. Ascending aorta: Normal diameter 3.4 Pericardium: Normal Coronary arteries: No calcium noted IMPRESSION: Coronary calcium score of 0. [name] Electronically Signed   By: [name] M.D.   On: 10/24/2018 13:57   Result Date: 10/24/2018 EXAM: OVER-READ INTERPRETATION  CT CHEST The following report is an over-read performed by radiologist Dr. Markus Daft of [facility], [place] on 10/24/2018. This over-read does not include interpretation of cardiac or coronary anatomy or pathology. The coronary calcium score/coronary CTA interpretation by the cardiologist is attached. COMPARISON:  None. FINDINGS: Vascular: Normal caliber of the visualized thoracic aorta without atherosclerotic calcifications. Heart size is normal with minimal pericardial fluid. Normal caliber of the main pulmonary arteries. Mediastinum/Nodes: Visualized mediastinal and hilar tissue is unremarkable. Lungs/Pleura: No pleural effusions. 4 mm peripheral nodular density in the right middle lobe on sequence 5 image 24. There is concern for mild centrilobular emphysema. Mild scarring or atelectasis along the medial right middle lobe. No large areas of airspace disease or consolidation in the visualized lungs. Focal thickening along the right minor fissure on sequence 5, image 13. Upper Abdomen: Images of the upper abdomen are unremarkable. Musculoskeletal: Visualized bone structures are unremarkable. IMPRESSION: 1. No acute findings. 2. Concern for mild  centrilobular emphysema. 3. Indeterminate 4 mm nodule in the right middle lobe. No follow-up needed if patient is low-risk. Non-contrast chest CT can be considered in 12 months if patient is high-risk. This recommendation follows the consensus statement: Guidelines for Management of Incidental Pulmonary Nodules Detected on CT Images: From the Fleischner Society 2017; Radiology 2017; 284:228-243. Electronically Signed: By: Markus Daft M.D. On: 10/24/2018 08:59    Assessment & Plan:   There are no diagnoses linked to this encounter.   No orders of the defined types were placed in this encounter.    Follow-up: No follow-ups on file.  Walker Kehr, MD

## 2019-01-31 ENCOUNTER — Telehealth: Payer: Self-pay | Admitting: Internal Medicine

## 2019-01-31 MED ORDER — LISINOPRIL-HYDROCHLOROTHIAZIDE 10-12.5 MG PO TABS: 1.0000 | ORAL_TABLET | Freq: Every day | ORAL | 3 refills | Status: DC

## 2019-01-31 NOTE — Telephone Encounter (Signed)
Copied from [place] [id number]. Topic: Quick Communication - See Telephone Encounter >> Jan 31, 2019 12:44 PM Rutherford Nail, NT wrote: CRM for notification. See Telephone encounter for: 01/31/19. Patient's wife calling and states that the pharmacy states that they never received the lisinopril-hydrochlorothiazide (PRINZIDE,ZESTORETIC) 10-12.5 MG tablet prescription from 12/28/2018. Please advise. Would like to know if this could be resent to the pharmacy?  [id number] - [place] [address]), [place] - [place]

## 2019-01-31 NOTE — Telephone Encounter (Signed)
RX resent

## 2019-04-04 DIAGNOSIS — Z1283 Encounter for screening for malignant neoplasm of skin: Secondary | ICD-10-CM | POA: Diagnosis not present

## 2019-04-04 DIAGNOSIS — Z08 Encounter for follow-up examination after completed treatment for malignant neoplasm: Secondary | ICD-10-CM | POA: Diagnosis not present

## 2019-04-04 DIAGNOSIS — Z85820 Personal history of malignant melanoma of skin: Secondary | ICD-10-CM | POA: Diagnosis not present

## 2019-04-04 DIAGNOSIS — D225 Melanocytic nevi of trunk: Secondary | ICD-10-CM | POA: Diagnosis not present

## 2019-06-08 ENCOUNTER — Telehealth: Payer: Self-pay | Admitting: Pulmonary Disease

## 2019-06-09 NOTE — Telephone Encounter (Signed)
lvm

## 2019-06-29 ENCOUNTER — Other Ambulatory Visit: Payer: Self-pay

## 2019-06-29 ENCOUNTER — Ambulatory Visit (INDEPENDENT_AMBULATORY_CARE_PROVIDER_SITE_OTHER): Payer: BC Managed Care – PPO | Admitting: Internal Medicine

## 2019-06-29 ENCOUNTER — Encounter: Payer: Self-pay | Admitting: Internal Medicine

## 2019-06-29 VITALS — BP 130/80 | HR 99 | Temp 97.9°F | Ht 71.0 in | Wt 241.0 lb

## 2019-06-29 DIAGNOSIS — Z23 Encounter for immunization: Secondary | ICD-10-CM | POA: Diagnosis not present

## 2019-06-29 DIAGNOSIS — D126 Benign neoplasm of colon, unspecified: Secondary | ICD-10-CM | POA: Diagnosis not present

## 2019-06-29 DIAGNOSIS — Z6831 Body mass index (BMI) 31.0-31.9, adult: Secondary | ICD-10-CM | POA: Diagnosis not present

## 2019-06-29 DIAGNOSIS — R0683 Snoring: Secondary | ICD-10-CM

## 2019-06-29 DIAGNOSIS — E669 Obesity, unspecified: Secondary | ICD-10-CM | POA: Insufficient documentation

## 2019-06-29 DIAGNOSIS — E6609 Other obesity due to excess calories: Secondary | ICD-10-CM | POA: Diagnosis not present

## 2019-06-29 NOTE — Assessment & Plan Note (Signed)
Diet 

## 2019-06-29 NOTE — Progress Notes (Signed)
Subjective:  Patient ID: Jon King, male    DOB: [birth year]  Age: 53 y.o. MRN: [id number]  CC: No chief complaint on file.   HPI 35 Jon King presents for HTN, colon polyps f/u C/o wt gain  Outpatient Medications Prior to Visit  Medication Sig Dispense Refill  . Icosapent Ethyl (VASCEPA) 1 g CAPS Take 2 capsules (2 g total) by mouth 2 (two) times daily. 120 capsule 11  . lisinopril-hydrochlorothiazide (PRINZIDE,ZESTORETIC) 10-12.5 MG tablet Take 1 tablet by mouth daily. 90 tablet 3  . Multiple Vitamin (MULTIVITAMIN) tablet Take 1 tablet by mouth daily.     No facility-administered medications prior to visit.     ROS: Review of Systems  Constitutional: Negative for appetite change, fatigue and unexpected weight change.  HENT: Negative for congestion, nosebleeds, sneezing, sore throat and trouble swallowing.   Eyes: Negative for itching and visual disturbance.  Respiratory: Negative for cough.   Cardiovascular: Negative for chest pain, palpitations and leg swelling.  Gastrointestinal: Negative for abdominal distention, blood in stool, diarrhea and nausea.  Genitourinary: Negative for frequency and hematuria.  Musculoskeletal: Negative for back pain, gait problem, joint swelling and neck pain.  Skin: Negative for rash.  Neurological: Negative for dizziness, tremors, speech difficulty and weakness.  Psychiatric/Behavioral: Negative for agitation, dysphoric mood, sleep disturbance and suicidal ideas. The patient is not nervous/anxious.     Objective:  BP 130/80 (BP Location: Left Arm, Patient Position: Sitting, Cuff Size: Large)   Pulse 99   Temp 97.9 F (36.6 C) (Oral)   Ht 5\' 11"  (1.803 m)   Wt 241 lb (109.3 kg)   SpO2 97%   BMI 33.61 kg/m   BP Readings from Last 3 Encounters:  06/29/19 130/80  12/28/18 124/80  09/27/18 118/78    Wt Readings from Last 3 Encounters:  06/29/19 241 lb (109.3 kg)  12/28/18 224 lb (101.6 kg)  09/27/18 223 lb (101.2 kg)     Physical Exam Constitutional:      General: He is not in acute distress.    Appearance: He is well-developed. He is obese.     Comments: NAD  Eyes:     Conjunctiva/sclera: Conjunctivae normal.     Pupils: Pupils are equal, round, and reactive to light.  Neck:     Musculoskeletal: Normal range of motion.     Thyroid: No thyromegaly.     Vascular: No JVD.  Cardiovascular:     Rate and Rhythm: Normal rate and regular rhythm.     Heart sounds: Normal heart sounds. No murmur. No friction rub. No gallop.   Pulmonary:     Effort: Pulmonary effort is normal. No respiratory distress.     Breath sounds: Normal breath sounds. No wheezing or rales.  Chest:     Chest wall: No tenderness.  Abdominal:     General: Bowel sounds are normal. There is no distension.     Palpations: Abdomen is soft. There is no mass.     Tenderness: There is no abdominal tenderness. There is no guarding or rebound.  Musculoskeletal: Normal range of motion.        General: No tenderness.  Lymphadenopathy:     Cervical: No cervical adenopathy.  Skin:    General: Skin is warm and dry.     Findings: No rash.  Neurological:     Mental Status: He is alert and oriented to person, place, and time.     Cranial Nerves: No cranial nerve deficit.  Motor: No abnormal muscle tone.     Coordination: Coordination normal.     Gait: Gait normal.     Deep Tendon Reflexes: Reflexes are normal and symmetric.  Psychiatric:        Behavior: Behavior normal.        Thought Content: Thought content normal.        Judgment: Judgment normal.     Lab Results  Component Value Date   WBC 9.6 09/27/2018   HGB 16.0 09/27/2018   HCT 45.9 09/27/2018   PLT 249.0 09/27/2018   GLUCOSE 95 09/27/2018   CHOL 219 (H) 09/27/2018   TRIG (H) 09/27/2018    532.0 Triglyceride is over 400; calculations on Lipids are invalid.   HDL 36.80 (L) 09/27/2018   LDLDIRECT 120.0 09/27/2018   ALT 21 09/27/2018   AST 13 09/27/2018   NA 139  09/27/2018   K 3.9 09/27/2018   CL 103 09/27/2018   CREATININE 0.87 09/27/2018   BUN 20 09/27/2018   CO2 26 09/27/2018   TSH 2.10 09/27/2018   PSA 0.46 09/27/2018    Ct Cardiac Scoring  Addendum Date: 10/24/2018   ADDENDUM REPORT: 10/24/2018 13:57 CLINICAL DATA:  Risk stratification EXAM: Coronary Calcium Score TECHNIQUE: The patient was scanned on Jon Siemens Somatom 64 slice scanner. Axial non-contrast 3 mm slices were carried out through the heart. The data set was analyzed on Jon dedicated work station and scored using the [place]. FINDINGS: Non-cardiac: See separate report from [facility]. Ascending aorta: Normal diameter 3.4 Pericardium: Normal Coronary arteries: No calcium noted IMPRESSION: Coronary calcium score of 0. [name] Electronically Signed   By: [name] M.D.   On: 10/24/2018 13:57   Result Date: 10/24/2018 EXAM: OVER-READ INTERPRETATION  CT CHEST The following report is an over-read performed by radiologist Dr. Markus Daft of [facility], [place] on 10/24/2018. This over-read does not include interpretation of cardiac or coronary anatomy or pathology. The coronary calcium score/coronary CTA interpretation by the cardiologist is attached. COMPARISON:  None. FINDINGS: Vascular: Normal caliber of the visualized thoracic aorta without atherosclerotic calcifications. Heart size is normal with minimal pericardial fluid. Normal caliber of the main pulmonary arteries. Mediastinum/Nodes: Visualized mediastinal and hilar tissue is unremarkable. Lungs/Pleura: No pleural effusions. 4 mm peripheral nodular density in the right middle lobe on sequence 5 image 24. There is concern for mild centrilobular emphysema. Mild scarring or atelectasis along the medial right middle lobe. No large areas of airspace disease or consolidation in the visualized lungs. Focal thickening along the right minor fissure on sequence 5, image 13. Upper Abdomen: Images of the upper abdomen are  unremarkable. Musculoskeletal: Visualized bone structures are unremarkable. IMPRESSION: 1. No acute findings. 2. Concern for mild centrilobular emphysema. 3. Indeterminate 4 mm nodule in the right middle lobe. No follow-up needed if patient is low-risk. Non-contrast chest CT can be considered in 12 months if patient is high-risk. This recommendation follows the consensus statement: Guidelines for Management of Incidental Pulmonary Nodules Detected on CT Images: From the Fleischner Society 2017; Radiology 2017; 284:228-243. Electronically Signed: By: Markus Daft M.D. On: 10/24/2018 08:59    Assessment & Plan:   Diagnoses and all orders for this visit:  Need for influenza vaccination -     Flu Vaccine QUAD 36+ mos IM     No orders of the defined types were placed in this encounter.    Follow-up: No follow-ups on file.  Walker Kehr, MD

## 2019-06-29 NOTE — Assessment & Plan Note (Signed)
Sleep test is planned

## 2019-06-29 NOTE — Patient Instructions (Signed)

## 2019-07-12 ENCOUNTER — Other Ambulatory Visit: Payer: Self-pay

## 2019-07-12 ENCOUNTER — Encounter: Payer: Self-pay | Admitting: Family

## 2019-07-12 ENCOUNTER — Telehealth: Payer: Self-pay | Admitting: Internal Medicine

## 2019-07-12 ENCOUNTER — Ambulatory Visit (INDEPENDENT_AMBULATORY_CARE_PROVIDER_SITE_OTHER): Payer: BC Managed Care – PPO | Admitting: Family

## 2019-07-12 VITALS — BP 132/76 | HR 101 | Temp 98.1°F | Ht 71.0 in | Wt 238.6 lb

## 2019-07-12 DIAGNOSIS — M79604 Pain in right leg: Secondary | ICD-10-CM | POA: Diagnosis not present

## 2019-07-12 MED ORDER — MELOXICAM 15 MG PO TABS: 15.0000 mg | ORAL_TABLET | Freq: Every day | ORAL | 0 refills | Status: DC

## 2019-07-12 MED ORDER — CYCLOBENZAPRINE HCL 10 MG PO TABS: 10.0000 mg | ORAL_TABLET | Freq: Every day | ORAL | 0 refills | Status: DC

## 2019-07-12 NOTE — Progress Notes (Signed)
ALOYS King is a 53 y.o. male with the following history as recorded in EpicCare:  Patient Active Problem List   Diagnosis Date Noted  . Obese 06/29/2019  . Hypertriglyceridemia 12/28/2018  . Snoring 12/28/2018  . Well adult exam 09/27/2018  . Tachycardia 09/27/2018  . Colon polyps 09/27/2018  . Keloid scar 09/27/2018  . Cervicalgia 07/12/2013  . History of kidney stones   . [id number])     Current Outpatient Medications  Medication Sig Dispense Refill  . cyclobenzaprine (FLEXERIL) 10 MG tablet Take 1 tablet (10 mg total) by mouth at bedtime. 30 tablet 0  . Icosapent Ethyl (VASCEPA) 1 g CAPS Take 2 capsules (2 g total) by mouth 2 (two) times daily. 120 capsule 11  . lisinopril-hydrochlorothiazide (PRINZIDE,ZESTORETIC) 10-12.5 MG tablet Take 1 tablet by mouth daily. 90 tablet 3  . meloxicam (MOBIC) 15 MG tablet Take 1 tablet (15 mg total) by mouth daily. 30 tablet 0  . Multiple Vitamin (MULTIVITAMIN) tablet Take 1 tablet by mouth daily.     No current facility-administered medications for this visit.     Allergies: Patient has no known allergies.  Past Medical History:  Diagnosis Date  . Degenerative disc disease, cervical   . Headache(784.0)    orginate from nerve thing in head  . History of kidney stones    last stone nov 2015  . Skin rash    right should road rash healing no drainage    Past Surgical History:  Procedure Laterality Date  . COLONOSCOPY  2010, 2011  . COLONOSCOPY WITH PROPOFOL N/A 05/13/2015   Procedure: COLONOSCOPY WITH PROPOFOL;  Surgeon: Garlan Fair, MD;  Location: [facility];  Service: Endoscopy;  Laterality: N/A;  . LITHOTRIPSY  2014    Family History  Problem Relation Age of Onset  . Lung cancer Father   . Lung cancer Sister   . Asthma Paternal Grandfather     Social History   Tobacco Use  . Smoking status: Former Smoker    Quit date: 09/11/2001    Years since quitting: 17.8  . Smokeless tobacco: Never Used  Substance Use  Topics  . Alcohol use: No    Subjective:  2 week history of right lower leg pain; started after jumping out of truck and landing wrong on his feet; no numbness or tingling; no swelling; limited benefit with Advil; job involves walking daily- does feel better once he is able to get home and rest;   Objective:  Vitals:   07/12/19 1304  BP: 132/76  Pulse: (!) 101  Temp: 98.1 F (36.7 C)  TempSrc: Oral  SpO2: 96%  Weight: 238 lb 9.6 oz (108.2 kg)  Height: 5\' 11"  (1.803 m)    General: Well developed, well nourished, in no acute distress  Skin : Warm and dry.  Head: Normocephalic and atraumatic  Lungs: Respirations unlabored;  Musculoskeletal: No deformities; no active joint inflammation  Extremities: No edema, cyanosis, clubbing  Vessels: Symmetric bilaterally  Neurologic: Alert and oriented; speech intact; face symmetrical; moves all extremities well; CNII-XII intact without focal deficit  Assessment:  1. Right leg pain     Plan:  Suspect soft tissue injury; trial of Mobic 15 mg daily; can try Flexeril at night; follow-up with response in 2 days.   No follow-ups on file.  No orders of the defined types were placed in this encounter.   Requested Prescriptions   Signed Prescriptions Disp Refills  . meloxicam (MOBIC) 15 MG tablet 30 tablet 0  Sig: Take 1 tablet (15 mg total) by mouth daily.  . cyclobenzaprine (FLEXERIL) 10 MG tablet 30 tablet 0    Sig: Take 1 tablet (10 mg total) by mouth at bedtime.

## 2019-07-12 NOTE — Telephone Encounter (Signed)
Copied from [place] [id number]. Topic: General - Other >> Jul 12, 2019  2:51 PM Leward Quan A wrote: Reason for CRM: Patient called he was seen today and Rx for cyclobenzaprine (FLEXERIL) 10 MG tablet and meloxicam (MOBIC) 15 MG tablet was sent but not available at the pharmacy on [facility] asking for them to be sent to [facility], [place] [phone] (Phone) [phone] (Fax)  Please call patient when this have been done at Ph# [phone]

## 2019-07-13 ENCOUNTER — Other Ambulatory Visit: Payer: Self-pay

## 2019-07-13 MED ORDER — MELOXICAM 15 MG PO TABS: 15.0000 mg | ORAL_TABLET | Freq: Every day | ORAL | 0 refills | Status: DC

## 2019-07-13 MED ORDER — CYCLOBENZAPRINE HCL 10 MG PO TABS: 10.0000 mg | ORAL_TABLET | Freq: Every day | ORAL | 0 refills | Status: DC

## 2019-07-13 NOTE — Telephone Encounter (Signed)
Refills sent in and left message for patient today to check with [facility] in [place].

## 2019-08-07 ENCOUNTER — Encounter: Payer: Self-pay | Admitting: Internal Medicine

## 2019-08-14 ENCOUNTER — Ambulatory Visit: Payer: BC Managed Care – PPO | Admitting: Family

## 2019-10-02 ENCOUNTER — Other Ambulatory Visit: Payer: Self-pay

## 2019-10-02 ENCOUNTER — Other Ambulatory Visit (INDEPENDENT_AMBULATORY_CARE_PROVIDER_SITE_OTHER): Payer: BC Managed Care – PPO

## 2019-10-02 ENCOUNTER — Encounter: Payer: Self-pay | Admitting: Internal Medicine

## 2019-10-02 ENCOUNTER — Ambulatory Visit (INDEPENDENT_AMBULATORY_CARE_PROVIDER_SITE_OTHER): Payer: BC Managed Care – PPO | Admitting: Internal Medicine

## 2019-10-02 VITALS — BP 134/88 | HR 107 | Temp 98.5°F | Ht 71.0 in | Wt 239.0 lb

## 2019-10-02 DIAGNOSIS — Z125 Encounter for screening for malignant neoplasm of prostate: Secondary | ICD-10-CM

## 2019-10-02 DIAGNOSIS — Z0000 Encounter for general adult medical examination without abnormal findings: Secondary | ICD-10-CM | POA: Diagnosis not present

## 2019-10-02 LAB — BASIC METABOLIC PANEL
BUN: 13 mg/dL (ref 6–23)
CO2: 29 mEq/L (ref 19–32)
Calcium: 9.5 mg/dL (ref 8.4–10.5)
Chloride: 104 mEq/L (ref 96–112)
Creatinine, Ser: 0.91 mg/dL (ref 0.40–1.50)
GFR: 87.02 mL/min (ref >60.00)
Glucose, Bld: 93 mg/dL (ref 70–99)
Potassium: 4 mEq/L (ref 3.5–5.1)
Sodium: 142 mEq/L (ref 135–145)

## 2019-10-02 LAB — URINALYSIS
Bilirubin Urine: NEGATIVE
Ketones, ur: NEGATIVE
Leukocytes,Ua: NEGATIVE
Nitrite: NEGATIVE
Specific Gravity, Urine: >=1.03 — AB (ref 1.000–1.030)
Total Protein, Urine: NEGATIVE
Urine Glucose: NEGATIVE
Urobilinogen, UA: 0.2 (ref 0.0–1.0)
pH: 5.5 (ref 5.0–8.0)

## 2019-10-02 LAB — CBC WITH DIFFERENTIAL/PLATELET
Basophils Absolute: 0.1 10*3/uL (ref 0.0–0.1)
Basophils Relative: 0.8 % (ref 0.0–3.0)
Eosinophils Absolute: 0.2 10*3/uL (ref 0.0–0.7)
Eosinophils Relative: 1.6 % (ref 0.0–5.0)
HCT: 43.6 % (ref 39.0–52.0)
Hemoglobin: 15.1 g/dL (ref 13.0–17.0)
Lymphocytes Relative: 23.2 % (ref 12.0–46.0)
Lymphs Abs: 2.2 10*3/uL (ref 0.7–4.0)
MCHC: 34.6 g/dL (ref 30.0–36.0)
MCV: 83.4 fl (ref 78.0–100.0)
Monocytes Absolute: 0.9 10*3/uL (ref 0.1–1.0)
Monocytes Relative: 9.6 % (ref 3.0–12.0)
Neutro Abs: 6.2 10*3/uL (ref 1.4–7.7)
Neutrophils Relative %: 64.8 % (ref 43.0–77.0)
Platelets: 228 10*3/uL (ref 150.0–400.0)
RBC: 5.22 Mil/uL (ref 4.22–5.81)
RDW: 13.6 % (ref 11.5–15.5)
WBC: 9.5 10*3/uL (ref 4.0–10.5)

## 2019-10-02 LAB — TSH: TSH: 1.99 u[IU]/mL (ref 0.35–4.50)

## 2019-10-02 LAB — HEPATIC FUNCTION PANEL
ALT: 23 U/L (ref 0–53)
AST: 16 U/L (ref 0–37)
Albumin: 4.5 g/dL (ref 3.5–5.2)
Alkaline Phosphatase: 61 U/L (ref 39–117)
Bilirubin, Direct: 0.1 mg/dL (ref 0.0–0.3)
Total Bilirubin: 0.9 mg/dL (ref 0.2–1.2)
Total Protein: 6.9 g/dL (ref 6.0–8.3)

## 2019-10-02 LAB — LIPID PANEL
Cholesterol: 180 mg/dL (ref 0–200)
HDL: 36.9 mg/dL — ABNORMAL LOW (ref >39.00)
Total CHOL/HDL Ratio: 5
Triglycerides: 412 mg/dL — ABNORMAL HIGH (ref 0.0–149.0)

## 2019-10-02 LAB — PSA: PSA: 0.53 ng/mL (ref 0.10–4.00)

## 2019-10-02 LAB — LDL CHOLESTEROL, DIRECT: Direct LDL: 92 mg/dL

## 2019-10-02 NOTE — Progress Notes (Signed)
Subjective:  Patient ID: Jon King, male    DOB: 09/03/1966  Age: 53 y.o. MRN: [id number]  CC: No chief complaint on file.   HPI Jon King presents for a well exam  Outpatient Medications Prior to Visit  Medication Sig Dispense Refill  . cyclobenzaprine (FLEXERIL) 10 MG tablet Take 1 tablet (10 mg total) by mouth at bedtime. 30 tablet 0  . Icosapent Ethyl (VASCEPA) 1 g CAPS Take 2 capsules (2 g total) by mouth 2 (two) times daily. 120 capsule 11  . lisinopril-hydrochlorothiazide (PRINZIDE,ZESTORETIC) 10-12.5 MG tablet Take 1 tablet by mouth daily. 90 tablet 3  . meloxicam (MOBIC) 15 MG tablet Take 1 tablet (15 mg total) by mouth daily. 30 tablet 0  . Multiple Vitamin (MULTIVITAMIN) tablet Take 1 tablet by mouth daily.     No facility-administered medications prior to visit.     ROS: Review of Systems  Constitutional: Positive for unexpected weight change. Negative for appetite change and fatigue.  HENT: Negative for congestion, nosebleeds, sneezing, sore throat and trouble swallowing.   Eyes: Negative for itching and visual disturbance.  Respiratory: Negative for cough.   Cardiovascular: Negative for chest pain, palpitations and leg swelling.  Gastrointestinal: Negative for abdominal distention, blood in stool, diarrhea and nausea.  Genitourinary: Negative for frequency and hematuria.  Musculoskeletal: Positive for back pain. Negative for gait problem, joint swelling and neck pain.  Skin: Negative for rash.  Neurological: Negative for dizziness, tremors, speech difficulty and weakness.  Psychiatric/Behavioral: Negative for agitation, dysphoric mood, sleep disturbance and suicidal ideas. The patient is not nervous/anxious.     Objective:  BP 134/88 (BP Location: Left Arm, Patient Position: Sitting, Cuff Size: Large)   Pulse (!) 107   Temp 98.5 F (36.9 C) (Oral)   Ht 5\' 11"  (1.803 m)   Wt 239 lb (108.4 kg)   SpO2 96%   BMI 33.33 kg/m   BP Readings from Last 3  Encounters:  10/02/19 134/88  07/12/19 132/76  06/29/19 130/80    Wt Readings from Last 3 Encounters:  10/02/19 239 lb (108.4 kg)  07/12/19 238 lb 9.6 oz (108.2 kg)  06/29/19 241 lb (109.3 kg)    Physical Exam Constitutional:      General: He is not in acute distress.    Appearance: He is well-developed. He is obese.     Comments: NAD  Eyes:     Conjunctiva/sclera: Conjunctivae normal.     Pupils: Pupils are equal, round, and reactive to light.  Neck:     Musculoskeletal: Normal range of motion.     Thyroid: No thyromegaly.     Vascular: No JVD.  Cardiovascular:     Rate and Rhythm: Normal rate and regular rhythm.     Heart sounds: Normal heart sounds. No murmur. No friction rub. No gallop.   Pulmonary:     Effort: Pulmonary effort is normal. No respiratory distress.     Breath sounds: Normal breath sounds. No wheezing or rales.  Chest:     Chest wall: No tenderness.  Abdominal:     General: Bowel sounds are normal. There is no distension.     Palpations: Abdomen is soft. There is no mass.     Tenderness: There is no abdominal tenderness. There is no guarding or rebound.  Musculoskeletal: Normal range of motion.        General: No tenderness.  Lymphadenopathy:     Cervical: No cervical adenopathy.  Skin:    General: Skin is  warm and dry.     Findings: No rash.  Neurological:     Mental Status: He is alert and oriented to person, place, and time.     Cranial Nerves: No cranial nerve deficit.     Motor: No abnormal muscle tone.     Coordination: Coordination normal.     Gait: Gait normal.     Deep Tendon Reflexes: Reflexes are normal and symmetric.  Psychiatric:        Behavior: Behavior normal.        Thought Content: Thought content normal.        Judgment: Judgment normal.   rectal - per GI pending soon  Lab Results  Component Value Date   WBC 9.6 09/27/2018   HGB 16.0 09/27/2018   HCT 45.9 09/27/2018   PLT 249.0 09/27/2018   GLUCOSE 95 09/27/2018    CHOL 219 (H) 09/27/2018   TRIG (H) 09/27/2018    532.0 Triglyceride is over 400; calculations on Lipids are invalid.   HDL 36.80 (L) 09/27/2018   LDLDIRECT 120.0 09/27/2018   ALT 21 09/27/2018   AST 13 09/27/2018   NA 139 09/27/2018   K 3.9 09/27/2018   CL 103 09/27/2018   CREATININE 0.87 09/27/2018   BUN 20 09/27/2018   CO2 26 09/27/2018   TSH 2.10 09/27/2018   PSA 0.46 09/27/2018    Ct Cardiac Scoring  Addendum Date: 10/24/2018   ADDENDUM REPORT: 10/24/2018 13:57 CLINICAL DATA:  Risk stratification EXAM: Coronary Calcium Score TECHNIQUE: The patient was scanned on a Siemens Somatom 64 slice scanner. Axial non-contrast 3 mm slices were carried out through the heart. The data set was analyzed on a dedicated work station and scored using the [place]. FINDINGS: Non-cardiac: See separate report from [facility]. Ascending aorta: Normal diameter 3.4 Pericardium: Normal Coronary arteries: No calcium noted IMPRESSION: Coronary calcium score of 0. [name] Electronically Signed   By: [name] M.D.   On: 10/24/2018 13:57   Result Date: 10/24/2018 EXAM: OVER-READ INTERPRETATION  CT CHEST The following report is an over-read performed by radiologist Dr. Markus Daft of [facility], [place] on 10/24/2018. This over-read does not include interpretation of cardiac or coronary anatomy or pathology. The coronary calcium score/coronary CTA interpretation by the cardiologist is attached. COMPARISON:  None. FINDINGS: Vascular: Normal caliber of the visualized thoracic aorta without atherosclerotic calcifications. Heart size is normal with minimal pericardial fluid. Normal caliber of the main pulmonary arteries. Mediastinum/Nodes: Visualized mediastinal and hilar tissue is unremarkable. Lungs/Pleura: No pleural effusions. 4 mm peripheral nodular density in the right middle lobe on sequence 5 image 24. There is concern for mild centrilobular emphysema. Mild scarring or atelectasis along  the medial right middle lobe. No large areas of airspace disease or consolidation in the visualized lungs. Focal thickening along the right minor fissure on sequence 5, image 13. Upper Abdomen: Images of the upper abdomen are unremarkable. Musculoskeletal: Visualized bone structures are unremarkable. IMPRESSION: 1. No acute findings. 2. Concern for mild centrilobular emphysema. 3. Indeterminate 4 mm nodule in the right middle lobe. No follow-up needed if patient is low-risk. Non-contrast chest CT can be considered in 12 months if patient is high-risk. This recommendation follows the consensus statement: Guidelines for Management of Incidental Pulmonary Nodules Detected on CT Images: From the Fleischner Society 2017; Radiology 2017; 284:228-243. Electronically Signed: By: Markus Daft M.D. On: 10/24/2018 08:59    Assessment & Plan:   There are no diagnoses linked to this encounter.  No orders of the defined types were placed in this encounter.    Follow-up: No follow-ups on file.  Walker Kehr, MD

## 2019-10-02 NOTE — Assessment & Plan Note (Addendum)
We discussed age appropriate health related issues, including available/recomended screening tests and vaccinations. We discussed a need for adhering to healthy diet and exercise. Labs were ordered to be later reviewed . All questions were answered. Shingrix advised - next year Wt loss Colon due in Jan 2021 2019 Coronary calcium score of 0.

## 2019-10-02 NOTE — Patient Instructions (Signed)
Shingrix

## 2019-10-03 DIAGNOSIS — D2271 Melanocytic nevi of right lower limb, including hip: Secondary | ICD-10-CM | POA: Diagnosis not present

## 2019-10-03 DIAGNOSIS — X32XXXA Exposure to sunlight, initial encounter: Secondary | ICD-10-CM | POA: Diagnosis not present

## 2019-10-03 DIAGNOSIS — L570 Actinic keratosis: Secondary | ICD-10-CM | POA: Diagnosis not present

## 2019-10-03 DIAGNOSIS — D485 Neoplasm of uncertain behavior of skin: Secondary | ICD-10-CM | POA: Diagnosis not present

## 2019-10-03 DIAGNOSIS — Z08 Encounter for follow-up examination after completed treatment for malignant neoplasm: Secondary | ICD-10-CM | POA: Diagnosis not present

## 2019-10-03 DIAGNOSIS — L821 Other seborrheic keratosis: Secondary | ICD-10-CM | POA: Diagnosis not present

## 2019-10-03 DIAGNOSIS — Z86006 Personal history of melanoma in-situ: Secondary | ICD-10-CM | POA: Diagnosis not present

## 2019-10-03 DIAGNOSIS — Z1283 Encounter for screening for malignant neoplasm of skin: Secondary | ICD-10-CM | POA: Diagnosis not present

## 2019-10-12 DIAGNOSIS — R519 Headache, unspecified: Secondary | ICD-10-CM | POA: Diagnosis not present

## 2019-10-12 DIAGNOSIS — J029 Acute pharyngitis, unspecified: Secondary | ICD-10-CM | POA: Diagnosis not present

## 2019-10-12 DIAGNOSIS — Z20828 Contact with and (suspected) exposure to other viral communicable diseases: Secondary | ICD-10-CM | POA: Diagnosis not present

## 2019-10-12 DIAGNOSIS — J3489 Other specified disorders of nose and nasal sinuses: Secondary | ICD-10-CM | POA: Diagnosis not present

## 2019-11-16 ENCOUNTER — Telehealth: Payer: Self-pay

## 2019-11-16 NOTE — Telephone Encounter (Signed)
New message    Wife calling check on the status of coloscopy referral.     The referral is closed on  08/07/19

## 2019-11-17 NOTE — Telephone Encounter (Signed)
LM giving GI contact info

## 2019-11-27 ENCOUNTER — Telehealth: Payer: Self-pay | Admitting: Internal Medicine

## 2019-11-27 NOTE — Telephone Encounter (Signed)
Hey Dr. Hilarie Fredrickson- this patient Jon King is being referred to [place] from Dr. Alain Marion for a colonoscopy. They are requesting you for the doctor. I have the records from his last colonoscopy and I am going to send it to you for review. Please advise. Thank you.

## 2019-11-27 NOTE — Telephone Encounter (Signed)
History of adenomatous colon polyps, several in his past which were advanced with high-grade dysplasia.  Looks like additional polypectomy was 2011, 10 years ago at age 54 Okay to repeat screening colonoscopy at this time, okay to add to my schedule

## 2019-11-28 ENCOUNTER — Encounter: Payer: Self-pay | Admitting: Internal Medicine

## 2019-11-29 NOTE — Telephone Encounter (Signed)
Patient scheduled for colonoscopy on 3/1 at 1:30 with Dr. Hilarie Fredrickson.

## 2019-12-05 ENCOUNTER — Ambulatory Visit (INDEPENDENT_AMBULATORY_CARE_PROVIDER_SITE_OTHER): Payer: 59 | Admitting: Pulmonary Disease

## 2019-12-05 ENCOUNTER — Other Ambulatory Visit: Payer: Self-pay

## 2019-12-05 ENCOUNTER — Encounter: Payer: Self-pay | Admitting: Pulmonary Disease

## 2019-12-05 VITALS — BP 122/84 | HR 99 | Temp 97.8°F | Ht 69.0 in | Wt 242.6 lb

## 2019-12-05 DIAGNOSIS — G4733 Obstructive sleep apnea (adult) (pediatric): Secondary | ICD-10-CM | POA: Diagnosis not present

## 2019-12-05 NOTE — Patient Instructions (Signed)
High probability of significant obstructive sleep apnea  We will schedule you for home sleep study  I will see you back in the office in about 3 months  Call with any significant concerns   Sleep Apnea Sleep apnea is a condition in which breathing pauses or becomes shallow during sleep. Episodes of sleep apnea usually last 10 seconds or longer, and they may occur as many as 20 times an hour. Sleep apnea disrupts your sleep and keeps your body from getting the rest that it needs. This condition can increase your risk of certain health problems, including:  Heart attack.  Stroke.  Obesity.  Diabetes.  Heart failure.  Irregular heartbeat. What are the causes? There are three kinds of sleep apnea:  Obstructive sleep apnea. This kind is caused by a blocked or collapsed airway.  Central sleep apnea. This kind happens when the part of the brain that controls breathing does not send the correct signals to the muscles that control breathing.  Mixed sleep apnea. This is a combination of obstructive and central sleep apnea. The most common cause of this condition is a collapsed or blocked airway. An airway can collapse or become blocked if:  Your throat muscles are abnormally relaxed.  Your tongue and tonsils are larger than normal.  You are overweight.  Your airway is smaller than normal. What increases the risk? You are more likely to develop this condition if you:  Are overweight.  Smoke.  Have a smaller than normal airway.  Are elderly.  Are male.  Drink alcohol.  Take sedatives or tranquilizers.  Have a family history of sleep apnea. What are the signs or symptoms? Symptoms of this condition include:  Trouble staying asleep.  Daytime sleepiness and tiredness.  Irritability.  Loud snoring.  Morning headaches.  Trouble concentrating.  Forgetfulness.  Decreased interest in sex.  Unexplained sleepiness.  Mood swings.  Personality  changes.  Feelings of depression.  Waking up often during the night to urinate.  Dry mouth.  Sore throat. How is this diagnosed? This condition may be diagnosed with:  A medical history.  A physical exam.  A series of tests that are done while you are sleeping (sleep study). These tests are usually done in a sleep lab, but they may also be done at home. How is this treated? Treatment for this condition aims to restore normal breathing and to ease symptoms during sleep. It may involve managing health issues that can affect breathing, such as high blood pressure or obesity. Treatment may include:  Sleeping on your side.  Using a decongestant if you have nasal congestion.  Avoiding the use of depressants, including alcohol, sedatives, and narcotics.  Losing weight if you are overweight.  Making changes to your diet.  Quitting smoking.  Using a device to open your airway while you sleep, such as: ? An oral appliance. This is a custom-made mouthpiece that shifts your lower jaw forward. ? A continuous positive airway pressure (CPAP) device. This device blows air through a mask when you breathe out (exhale). ? A nasal expiratory positive airway pressure (EPAP) device. This device has valves that you put into each nostril. ? A bi-level positive airway pressure (BPAP) device. This device blows air through a mask when you breathe in (inhale) and breathe out (exhale).  Having surgery if other treatments do not work. During surgery, excess tissue is removed to create a wider airway. It is important to get treatment for sleep apnea. Without treatment, this condition can  lead to:  High blood pressure.  Coronary artery disease.  In men, an inability to achieve or maintain an erection (impotence).  Reduced thinking abilities. Follow these instructions at home: Lifestyle  Make any lifestyle changes that your health care provider recommends.  Eat a healthy, well-balanced  diet.  Take steps to lose weight if you are overweight.  Avoid using depressants, including alcohol, sedatives, and narcotics.  Do not use any products that contain nicotine or tobacco, such as cigarettes, e-cigarettes, and chewing tobacco. If you need help quitting, ask your health care provider. General instructions  Take over-the-counter and prescription medicines only as told by your health care provider.  If you were given a device to open your airway while you sleep, use it only as told by your health care provider.  If you are having surgery, make sure to tell your health care provider you have sleep apnea. You may need to bring your device with you.  Keep all follow-up visits as told by your health care provider. This is important. Contact a health care provider if:  The device that you received to open your airway during sleep is uncomfortable or does not seem to be working.  Your symptoms do not improve.  Your symptoms get worse. Get help right away if:  You develop: ? Chest pain. ? Shortness of breath. ? Discomfort in your back, arms, or stomach.  You have: ? Trouble speaking. ? Weakness on one side of your body. ? Drooping in your face. These symptoms may represent a serious problem that is an emergency. Do not wait to see if the symptoms will go away. Get medical help right away. Call your local emergency services (911 in the [place] Do not drive yourself to the hospital. Summary  Sleep apnea is a condition in which breathing pauses or becomes shallow during sleep.  The most common cause is a collapsed or blocked airway.  The goal of treatment is to restore normal breathing and to ease symptoms during sleep. This information is not intended to replace advice given to you by your health care provider. Make sure you discuss any questions you have with your health care provider. Document Revised: 03/29/2019 Document Reviewed: 06/07/2018 Elsevier Patient Education   [employer].

## 2019-12-05 NOTE — Progress Notes (Signed)
Subjective:    Patient ID: Jon King, male    DOB: 28-Mar-1966, 54 y.o.   MRN: [id number]  Patient with history of snoring, witnessed apneas  Has had snoring witnessed apneas for many years Has been woken up by spouse multiple times recently trying to get him to breathe Usually goes to bed about 730 to 8 PM Falls asleep in about 5 minutes 4-5 awakenings at night Final awakening time 3 AM  Sleep is not restorative  He does have a history of hypertension  Dad snored heavily  Occasional dryness of his mouth in the mornings Occasional headaches in the morning Memory is fine    Past Medical History:  Diagnosis Date  . Degenerative disc disease, cervical   . Headache(784.0)    orginate from nerve thing in head  . History of kidney stones    last stone nov 2015  . Hypertension   . Skin rash    right should road rash healing no drainage    Social History   Socioeconomic History  . Marital status: Married    Spouse name: Judeen Hammans  . Number of children: 2  . Years of education: 63  . Highest education level: Not on file  Occupational History    Employer: [employer]    Comment: [place].  Tobacco Use  . Smoking status: Former Smoker    Packs/day: 1.00    Years: 20.00    Pack years: 20.00    Quit date: 09/11/2001    Years since quitting: 18.2  . Smokeless tobacco: Never Used  Substance and Sexual Activity  . Alcohol use: No  . Drug use: No  . Sexual activity: Yes  Other Topics Concern  . Not on file  Social History Narrative   Patient lives at home with his wife Judeen Hammans). Patient works full time. 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 Patient has high school education.   Right handed.   Caffeine- sodas - two daily.   Social Determinants of Health   Financial Resource Strain:   . Difficulty of Paying Living Expenses: Not on file  Food Insecurity:   . Worried About Charity fundraiser in the Last Year: Not on file  . Ran Out of Food in the Last  Year: Not on file  Transportation Needs:   . Lack of Transportation (Medical): Not on file  . Lack of Transportation (Non-Medical): Not on file  Physical Activity:   . Days of Exercise per Week: Not on file  . Minutes of Exercise per Session: Not on file  Stress:   . Feeling of Stress : Not on file  Social Connections:   . Frequency of Communication with Friends and Family: Not on file  . Frequency of Social Gatherings with Friends and Family: Not on file  . Attends Religious Services: Not on file  . Active Member of Clubs or Organizations: Not on file  . Attends Archivist Meetings: Not on file  . Marital Status: Not on file  Intimate Partner Violence:   . Fear of Current or Ex-Partner: Not on file  . Emotionally Abused: Not on file  . Physically Abused: Not on file  . Sexually Abused: Not on file   Family History  Problem Relation Age of Onset  . Lung cancer Father   . Lung cancer Sister   . Asthma Paternal Grandfather     Review of Systems  Constitutional: Negative for fever and unexpected weight change.  HENT:  Negative for congestion, dental problem, ear pain, nosebleeds, postnasal drip, rhinorrhea, sinus pressure, sneezing, sore throat and trouble swallowing.   Eyes: Negative for redness and itching.  Respiratory: Positive for wheezing. Negative for cough, chest tightness and shortness of breath.   Cardiovascular: Negative for palpitations and leg swelling.  Gastrointestinal: Negative for nausea and vomiting.  Genitourinary: Positive for dysuria.  Musculoskeletal: Negative for joint swelling.  Skin: Negative for rash.  Allergic/Immunologic: Negative.  Negative for environmental allergies, food allergies and immunocompromised state.  Neurological: Positive for headaches.  Hematological: Does not bruise/bleed easily.  Psychiatric/Behavioral: Negative for dysphoric mood. The patient is not nervous/anxious.       Objective:   Physical Exam HENT:     Head:  Normocephalic.     Nose: Nose normal.     Mouth/Throat:     Mouth: Mucous membranes are moist.     Comments: Crowded oropharynx, Mallampati 3 Eyes:     General:        Right eye: No discharge.     Conjunctiva/sclera: Conjunctivae normal.     Pupils: Pupils are equal, round, and reactive to light.  Cardiovascular:     Rate and Rhythm: Normal rate and regular rhythm.     Pulses: Normal pulses.     Heart sounds: Normal heart sounds. No murmur. No friction rub.  Pulmonary:     Effort: Pulmonary effort is normal. No respiratory distress.     Breath sounds: Normal breath sounds. No stridor. No wheezing or rhonchi.  Musculoskeletal:     Cervical back: Normal range of motion and neck supple. No rigidity or tenderness.  Neurological:     General: No focal deficit present.     Mental Status: He is alert.  Psychiatric:        Mood and Affect: Mood normal.    Vitals:   12/05/19 1443  BP: 122/84  Pulse: 99  Temp: 97.8 F (36.6 C)  SpO2: 95%   Results of the Epworth flowsheet 12/05/2019  Sitting and reading 2  Watching TV 2  Sitting, inactive in a public place (e.g. a theatre or a meeting) 1  As a passenger in a car for an hour without a break 2  Lying down to rest in the afternoon when circumstances permit 3  Sitting and talking to someone 1  Sitting quietly after a lunch without alcohol 1  In a car, while stopped for a few minutes in traffic 1  Total score 13       Assessment & Plan:  High probability of significant obstructive sleep apnea  Excessive daytime sleepiness  Obesity  Pathophysiology of sleep disordered breathing discussed with the patient  Treatment options for sleep disordered breathing discussed with the patient  Importance of weight management discussed with the patient  Plan: We will schedule the patient for home sleep study  We will see him back in about 3 months  Encouraged to work on weight loss efforts

## 2019-12-06 ENCOUNTER — Ambulatory Visit (AMBULATORY_SURGERY_CENTER): Payer: Self-pay

## 2019-12-06 ENCOUNTER — Other Ambulatory Visit: Payer: Self-pay

## 2019-12-06 VITALS — Temp 97.8°F | Ht 71.0 in | Wt 240.0 lb

## 2019-12-06 DIAGNOSIS — Z01818 Encounter for other preprocedural examination: Secondary | ICD-10-CM

## 2019-12-06 DIAGNOSIS — Z86010 Personal history of colonic polyps: Secondary | ICD-10-CM

## 2019-12-06 MED ORDER — SUTAB 1479-225-188 MG PO TABS: 1.0000 | ORAL_TABLET | ORAL | 0 refills | Status: DC

## 2019-12-06 NOTE — Progress Notes (Signed)
No egg or soy allergy known to patient  No issues with past sedation with any surgeries  or procedures, no intubation problems  No diet pills per patient No home 02 use per patient  No blood thinners per patient  Pt denies issues with constipation  No A fib or A flutter    Due to the COVID-19 pandemic we are asking patients to follow these guidelines. Please only bring one care partner. Please be aware that your care partner may wait in the car in the parking lot or if they feel like they will be too hot to wait in the car, they may wait in the lobby on the 4th floor. All care partners are required to wear a mask the entire time (we do not have any that we can provide them), they need to practice social distancing, and we will do a Covid check for all patient's and care partners when you arrive. Also we will check their temperature and your temperature. If the care partner waits in their car they need to stay in the parking lot the entire time and we will call them on their cell phone when the patient is ready for discharge so they can bring the car to the front of the building. Also all patient's will need to wear a mask into building.

## 2019-12-25 ENCOUNTER — Encounter: Payer: BC Managed Care – PPO | Admitting: Internal Medicine

## 2019-12-29 ENCOUNTER — Telehealth: Payer: Self-pay | Admitting: Internal Medicine

## 2019-12-29 NOTE — Telephone Encounter (Signed)
Pt's wife asked if I can send in e mail- Will send via [place] and will mail as well to pt new instructions with 4-6 colon date and times - verified address with pt's wife

## 2020-01-08 ENCOUNTER — Ambulatory Visit: Payer: 59

## 2020-01-08 ENCOUNTER — Other Ambulatory Visit: Payer: Self-pay

## 2020-01-08 DIAGNOSIS — G4733 Obstructive sleep apnea (adult) (pediatric): Secondary | ICD-10-CM

## 2020-01-18 DIAGNOSIS — G4733 Obstructive sleep apnea (adult) (pediatric): Secondary | ICD-10-CM | POA: Diagnosis not present

## 2020-01-19 ENCOUNTER — Telehealth: Payer: Self-pay | Admitting: Pulmonary Disease

## 2020-01-19 DIAGNOSIS — G4733 Obstructive sleep apnea (adult) (pediatric): Secondary | ICD-10-CM

## 2020-01-19 NOTE — Telephone Encounter (Signed)
Called and spoke with Patient.  Dr.Olalere's results and recommendations given.  Understanding stated.  Patient aware of 3 month follow up for insurance compliance.  DME order placed. Nothing further at this time.    Dr. Ander Slade has reviewed the home sleep test this showed moderate obstructive sleep apnea.   Recommendations   Treatment options are CPAP with the settings auto 5 to 15.    Weight loss measures .   Advise against driving while sleepy & against medication with sedative side effects.    Make appointment for 3 months for compliance with download with Dr. Ander Slade.

## 2020-01-25 ENCOUNTER — Encounter: Payer: Self-pay | Admitting: Internal Medicine

## 2020-01-26 ENCOUNTER — Ambulatory Visit (INDEPENDENT_AMBULATORY_CARE_PROVIDER_SITE_OTHER): Payer: 59

## 2020-01-26 ENCOUNTER — Other Ambulatory Visit: Payer: Self-pay | Admitting: Gastroenterology

## 2020-01-26 DIAGNOSIS — Z1159 Encounter for screening for other viral diseases: Secondary | ICD-10-CM

## 2020-01-26 LAB — SARS CORONAVIRUS 2 (TAT 6-24 HRS): SARS Coronavirus 2: NEGATIVE

## 2020-01-30 ENCOUNTER — Other Ambulatory Visit: Payer: Self-pay

## 2020-01-30 ENCOUNTER — Ambulatory Visit (AMBULATORY_SURGERY_CENTER): Payer: 59 | Admitting: Internal Medicine

## 2020-01-30 ENCOUNTER — Encounter: Payer: Self-pay | Admitting: Internal Medicine

## 2020-01-30 VITALS — BP 122/83 | HR 87 | Temp 97.5°F | Resp 14 | Ht 71.0 in | Wt 240.0 lb

## 2020-01-30 DIAGNOSIS — D124 Benign neoplasm of descending colon: Secondary | ICD-10-CM | POA: Diagnosis not present

## 2020-01-30 DIAGNOSIS — Z86010 Personal history of colonic polyps: Secondary | ICD-10-CM

## 2020-01-30 DIAGNOSIS — D123 Benign neoplasm of transverse colon: Secondary | ICD-10-CM | POA: Diagnosis not present

## 2020-01-30 DIAGNOSIS — D122 Benign neoplasm of ascending colon: Secondary | ICD-10-CM | POA: Diagnosis not present

## 2020-01-30 DIAGNOSIS — D125 Benign neoplasm of sigmoid colon: Secondary | ICD-10-CM | POA: Diagnosis not present

## 2020-01-30 MED ORDER — SODIUM CHLORIDE 0.9 % IV SOLN: 500.0000 mL | Freq: Once | INTRAVENOUS | Status: DC

## 2020-01-30 NOTE — Progress Notes (Signed)
Called to room to assist during endoscopic procedure.  Patient ID and intended procedure confirmed with present staff. Received instructions for my participation in the procedure from the performing physician.  

## 2020-01-30 NOTE — Progress Notes (Signed)
A/ox3, pleased with MAC, report to RN 

## 2020-01-30 NOTE — Progress Notes (Signed)
Pt's states no medical or surgical changes since previsit or office visit. 

## 2020-01-30 NOTE — Op Note (Signed)
[facility] Patient Name: Jon King Procedure Date: 01/30/2020 11:22 AM MRN: [id number] Endoscopist: Jerene Bears , MD Age: 54 Referring MD:  Date of Birth: 16-Aug-1966 Gender: Male Account #: [id number] Procedure:                Colonoscopy Indications:              High risk colon cancer surveillance: Personal                            history of multiple adenomatous polyps (several                            with high grade dysplasia in 2011), Last                            colonoscopy: 2016 Medicines:                Propofol per Anesthesia Procedure:                Pre-Anesthesia Assessment:                           - Prior to the procedure, a History and Physical                            was performed, and patient medications and                            allergies were reviewed. The patient's tolerance of                            previous anesthesia was also reviewed. The risks                            and benefits of the procedure and the sedation                            options and risks were discussed with the patient.                            All questions were answered, and informed consent                            was obtained. Prior Anticoagulants: The patient has                            taken no previous anticoagulant or antiplatelet                            agents. ASA Grade Assessment: II - A patient with                            mild systemic disease. After reviewing the risks  and benefits, the patient was deemed in                            satisfactory condition to undergo the procedure.                           After obtaining informed consent, the colonoscope                            was passed under direct vision. Throughout the                            procedure, the patient's blood pressure, pulse, and                            oxygen saturations were monitored continuously. The               Colonoscope was introduced through the anus and                            advanced to the cecum, identified by appendiceal                            orifice and ileocecal valve. The colonoscopy was                            performed without difficulty. The patient tolerated                            the procedure well. The quality of the bowel                            preparation was good. The ileocecal valve,                            appendiceal orifice, and rectum were photographed. Scope In: 11:38:26 AM Scope Out: 12:09:09 PM Scope Withdrawal Time: 0 hours 28 minutes 39 seconds  Total Procedure Duration: 0 hours 30 minutes 43 seconds  Findings:                 The digital rectal exam was normal.                           Two sessile polyps were found in the ascending                            colon. The polyps were 5 to 6 mm in size. These                            polyps were removed with a cold snare. Resection                            and retrieval were complete.  Five sessile polyps were found in the transverse                            colon. The polyps were 5 to 12 mm in size. These                            polyps were removed with a cold snare. Resection                            and retrieval were complete.                           Two sessile polyps were found in the descending                            colon. The polyps were 6 to 8 mm in size. These                            polyps were removed with a cold snare. Resection                            and retrieval were complete. For hemostasis, one                            hemostatic clip was successfully placed. There was                            no bleeding at the end of the maneuver.                           A 15 mm polyp was found in the descending colon.                            The polyp was sessile. The polyp was removed with a                             hot snare. Resection and retrieval were complete.                           Two sessile polyps were found in the sigmoid colon.                            The polyps were 4 to 5 mm in size. These polyps                            were removed with a cold snare. Resection and                            retrieval were complete.                           Internal hemorrhoids were found during  retroflexion. The hemorrhoids were small. Complications:            No immediate complications. Estimated Blood Loss:     Estimated blood loss was minimal. Impression:               - Two 5 to 6 mm polyps in the ascending colon,                            removed with a cold snare. Resected and retrieved.                           - Five 5 to 12 mm polyps in the transverse colon,                            removed with a cold snare. Resected and retrieved.                           - Two 6 to 8 mm polyps in the descending colon,                            removed with a cold snare. Resected and retrieved.                            Clip was placed.                           - One 15 mm polyp in the descending colon, removed                            with a hot snare. Resected and retrieved.                           - Two 4 to 5 mm polyps in the sigmoid colon,                            removed with a cold snare. Resected and retrieved.                           - Small internal hemorrhoids. Recommendation:           - Patient has a contact number available for                            emergencies. The signs and symptoms of potential                            delayed complications were discussed with the                            patient. Return to normal activities tomorrow.                            Written discharge instructions were provided to the  patient.                           - Resume previous diet.                           -  Continue present medications.                           - Await pathology results.                           - Repeat colonoscopy is recommended for                            surveillance. The colonoscopy date will be                            determined after pathology results from today's                            exam become available for review.                           - No ibuprofen, naproxen, or other non-steroidal                            anti-inflammatory drugs for 2 weeks after polyp                            removal. Jerene Bears, MD 01/30/2020 12:14:56 PM This report has been signed electronically.

## 2020-01-30 NOTE — Progress Notes (Signed)
Temperature taken by L.C., VS taken by C.W. 

## 2020-01-30 NOTE — Patient Instructions (Signed)
YOU HAD AN ENDOSCOPIC PROCEDURE TODAY AT [facility]:   Refer to the procedure report that was given to you for any specific questions about what was found during the examination.  If the procedure report does not answer your questions, please call your gastroenterologist to clarify.  If you requested that your care partner not be given the details of your procedure findings, then the procedure report has been included in a sealed envelope for you to review at your convenience later.  YOU SHOULD EXPECT: Some feelings of bloating in the abdomen. Passage of more gas than usual.  Walking can help get rid of the air that was put into your GI tract during the procedure and reduce the bloating. If you had a lower endoscopy (such as a colonoscopy or flexible sigmoidoscopy) you may notice spotting of blood in your stool or on the toilet paper. If you underwent a bowel prep for your procedure, you may not have a normal bowel movement for a few days.  Please Note:  You might notice some irritation and congestion in your nose or some drainage.  This is from the oxygen used during your procedure.  There is no need for concern and it should clear up in a day or so.  SYMPTOMS TO REPORT IMMEDIATELY:   Following lower endoscopy (colonoscopy or flexible sigmoidoscopy):  Excessive amounts of blood in the stool  Significant tenderness or worsening of abdominal pains  Swelling of the abdomen that is new, acute  Fever of 100F or higher    For urgent or emergent issues, a gastroenterologist can be reached at any hour by calling [phone]. Do not use MyChart messaging for urgent concerns.    DIET:  We do recommend a small meal at first, but then you may proceed to your regular diet.  Drink plenty of fluids but you should avoid alcoholic beverages for 24 hours.  ACTIVITY:  You should plan to take it easy for the rest of today and you should NOT DRIVE or use heavy machinery until tomorrow  (because of the sedation medicines used during the test).    FOLLOW UP: Our staff will call the number listed on your records 48-72 hours following your procedure to check on you and address any questions or concerns that you may have regarding the information given to you following your procedure. If we do not reach you, we will leave a message.  We will attempt to reach you two times.  During this call, we will ask if you have developed any symptoms of COVID 19. If you develop any symptoms (ie: fever, flu-like symptoms, shortness of breath, cough etc.) before then, please call [phone].  If you test positive for Covid 19 in the 2 weeks post procedure, please call and report this information to [place].    If any biopsies were taken you will be contacted by phone or by letter within the next 1-3 weeks.  Please call us at [phone] if you have not heard about the biopsies in 3 weeks.    SIGNATURES/CONFIDENTIALITY: You and/or your care partner have signed paperwork which will be entered into your electronic medical record.  These signatures attest to the fact that that the information above on your After Visit Summary has been reviewed and is understood.  Full responsibility of the confidentiality of this discharge information lies with you and/or your care-partner.   No ibuprofen,naproxen,or other non-steroidal anti-inflammatory drugs for 2 weeks,resume remainder of medications. Information given on  polyps,hemorrhoids,hemorrhoid banding,and clip card.

## 2020-02-01 ENCOUNTER — Telehealth: Payer: Self-pay | Admitting: *Deleted

## 2020-02-01 ENCOUNTER — Other Ambulatory Visit: Payer: Self-pay

## 2020-02-01 NOTE — Telephone Encounter (Signed)
  Follow up Call-  Call back number 01/30/2020  Post procedure Call Back phone  # [phone]  Permission to leave phone message Yes  Some recent data might be hidden     Patient questions:  Do you have a fever, pain , or abdominal swelling? No. Pain Score  0 *  Have you tolerated food without any problems? Yes.    Have you been able to return to your normal activities? Yes.    Do you have any questions about your discharge instructions: Diet   No. Medications  No. Follow up visit  No.  Do you have questions or concerns about your Care? No.  Actions: * If pain score is 4 or above: No action needed, pain <4.  1. Have you developed a fever since your procedure? no  2.   Have you had an respiratory symptoms (SOB or cough) since your procedure? no  3.   Have you tested positive for COVID 19 since your procedure no  4.   Have you had any family members/close contacts diagnosed with the COVID 19 since your procedure?  no   If yes to any of these questions please route to Joylene John, RN and Erenest Rasher, RN

## 2020-02-02 ENCOUNTER — Other Ambulatory Visit: Payer: Self-pay

## 2020-02-02 ENCOUNTER — Encounter: Payer: Self-pay | Admitting: Internal Medicine

## 2020-02-02 DIAGNOSIS — Z86010 Personal history of colonic polyps: Secondary | ICD-10-CM

## 2020-02-06 ENCOUNTER — Other Ambulatory Visit: Payer: Self-pay | Admitting: Internal Medicine

## 2020-02-20 ENCOUNTER — Telehealth: Payer: Self-pay | Admitting: Genetic Counselor

## 2020-02-20 NOTE — Telephone Encounter (Signed)
Received a genetic counseling referral from [name] for personal hx of colon polyps. An appt has been made with the pt's wife to see [name] on 5/3 at 2pm. Aware to arrive 15 minutes.

## 2020-02-26 ENCOUNTER — Inpatient Hospital Stay: Payer: 59 | Attending: Genetic Counselor | Admitting: Genetic Counselor

## 2020-02-26 ENCOUNTER — Inpatient Hospital Stay: Payer: 59

## 2020-02-26 ENCOUNTER — Encounter: Payer: Self-pay | Admitting: Genetic Counselor

## 2020-02-26 ENCOUNTER — Other Ambulatory Visit: Payer: Self-pay

## 2020-02-26 DIAGNOSIS — D126 Benign neoplasm of colon, unspecified: Secondary | ICD-10-CM | POA: Diagnosis not present

## 2020-02-26 DIAGNOSIS — Z803 Family history of malignant neoplasm of breast: Secondary | ICD-10-CM | POA: Diagnosis not present

## 2020-02-26 DIAGNOSIS — Z8371 Family history of colonic polyps: Secondary | ICD-10-CM | POA: Insufficient documentation

## 2020-02-26 DIAGNOSIS — Z8481 Family history of carrier of genetic disease: Secondary | ICD-10-CM | POA: Insufficient documentation

## 2020-02-26 DIAGNOSIS — Z801 Family history of malignant neoplasm of trachea, bronchus and lung: Secondary | ICD-10-CM | POA: Diagnosis not present

## 2020-02-26 DIAGNOSIS — Z800 Family history of malignant neoplasm of digestive organs: Secondary | ICD-10-CM | POA: Insufficient documentation

## 2020-02-26 NOTE — Progress Notes (Signed)
REFERRING PROVIDER: Jerene Bears, MD [address],  [place] [address]  PRIMARY PROVIDER:  Plotnikov, Evie Lacks, MD  PRIMARY REASON FOR VISIT:  1. Adenomatous polyp of colon, unspecified part of colon   2. Family history of colonic polyps   3. Family history of breast cancer   4. Family history of lung cancer   5. Family history of colon cancer   6. Family history of gene mutation      HISTORY OF PRESENT ILLNESS:   Mr. Lisenby, a 54 y.o. male, was seen for a [facility] cancer genetics consultation at the request of Dr. Hilarie Fredrickson due to a personal and family history of colon polyps and cancer.  Mr. Gowin presents to clinic today to discuss the possibility of a hereditary predisposition to colorectal polyps, genetic testing, and to further clarify his future cancer risks, as well as potential health risks for family members.   Mr. Maus was diagnosed with skin cancer on the back of his neck approximately 3 years ago at the age of 41. He has had multiple colonoscopies, altogether detecting more than 25 polyps (the majority of which were adenomatous polyps). His first colonoscopy was on 08/02/2010 at the age of 62 and his most recent colonoscopy was on 01/30/2020. He was recommended to return for another colonoscopy in one year.   Past Medical History:  Diagnosis Date  . Allergy   . Degenerative disc disease, cervical   . Family history of breast cancer   . Family history of colon cancer   . Family history of colonic polyps   . Family history of gene mutation   . Family history of lung cancer   . Headache(784.0)    orginate from nerve thing in head  . History of kidney stones    last stone nov 2015  . Hypertension   . Skin rash    right should road rash healing no drainage  . Sleep apnea    pending home test    Past Surgical History:  Procedure Laterality Date  . COLONOSCOPY  2010, 2011,05/13/15  . COLONOSCOPY WITH PROPOFOL N/A 05/13/2015   Procedure: COLONOSCOPY WITH  PROPOFOL;  Surgeon: Garlan Fair, MD;  Location: [facility];  Service: Endoscopy;  Laterality: N/A;  . LITHOTRIPSY  2014    Social History   Socioeconomic History  . Marital status: Married    Spouse name: Judeen Hammans  . Number of children: 2  . Years of education: 82  . Highest education level: Not on file  Occupational History    Employer: [employer]    Comment: [place].  Tobacco Use  . Smoking status: Former Smoker    Packs/day: 1.00    Years: 20.00    Pack years: 20.00    Quit date: 09/11/2001    Years since quitting: 18.4  . Smokeless tobacco: Never Used  Substance and Sexual Activity  . Alcohol use: No  . Drug use: No  . Sexual activity: Yes  Other Topics Concern  . Not on file  Social History Narrative   Patient lives at home with his wife Judeen Hammans). Patient works full time. 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 Patient has high school education.   Right handed.   Caffeine- sodas - two daily.   Social Determinants of Health   Financial Resource Strain:   . Difficulty of Paying Living Expenses:   Food Insecurity:   . Worried About Charity fundraiser in the Last Year:   .  Ran Out of Food in the Last Year:   Transportation Needs:   . Film/video editor (Medical):   Marland Kitchen Lack of Transportation (Non-Medical):   Physical Activity:   . Days of Exercise per Week:   . Minutes of Exercise per Session:   Stress:   . Feeling of Stress :   Social Connections:   . Frequency of Communication with Friends and Family:   . Frequency of Social Gatherings with Friends and Family:   . Attends Religious Services:   . Active Member of Clubs or Organizations:   . Attends Archivist Meetings:   Marland Kitchen Marital Status:      FAMILY HISTORY:  We obtained a detailed, 4-generation family history.  Significant diagnoses are listed below: Family History  Problem Relation Age of Onset  . Lung cancer Father 63  . Lung cancer Sister 25  . Colon polyps Sister         more than 10  . Breast cancer Mother 45  . Asthma Paternal Grandfather   . Breast cancer Other        diagnosed in her early 84s  . Cancer Paternal Uncle        diagnosed younger than 55, unknown type (3 paternal uncles)  . Cancer Cousin        diagnosed younger than 68, unknown type (paternal cousin)  . Colon cancer Other        mother's cousin; positive genetic testing in a "cancer gene"  . Esophageal cancer Neg Hx   . Rectal cancer Neg Hx   . Stomach cancer Neg Hx      Mr. Penkala has one son (age 70) and one daughter (age 17). He has one sister, Zorita Pang, who has a history of lung cancer diagnosed around the age of 45 and more than 30 colon polyps. His sister had genetic testing in 2017 which was normal and revealed a VUS in the ATM gene called [id number].  Mr. Vitiello's mother is in her 74s and has a history of breast cancer diagnosed in her 23s. His mother has not had any colon polyps. Mr. Auld has one maternal aunt who is in her 28s and has not had cancer. His maternal grandparents died in their 42s and did not have cancer. He has a maternal great-aunt (grandfather's sister) who had breast cancer in her early 44s. He also has a maternal cousin once-removed (mother's cousin) who had colon cancer and genetic testing that revealed a mutation in a "cancer gene". There are no other known diagnoses of cancer or colon polyps on the maternal side of the family.  Mr. Lyles's father died at the age of 85 from lung cancer that had been diagnosed at the age of 57. Mr. Blasius had five paternal uncles and two paternal aunts. Three of his paternal uncles died from an unknown cancer when they were younger than 62. One of his paternal cousins also died from an unknown cancer when she was younger than 19. His paternal grandparents died when they were in their 47s or 75s. There are no other known diagnoses of cancer or colon polyps on the paternal side of the family, although Mr. Petitjean notes he has  relatively limited information about his paternal family history.   Mr. Lang is aware of previous family history of genetic testing for hereditary cancer risks in his sister (normal) and in a fourth degree relative (abnormal - records not available for review). His ancestors are of [place] and  unknown descent. There is no reported Ashkenazi Jewish ancestry. There is no known consanguinity.  GENETIC COUNSELING ASSESSMENT: Mr. Anna is a 54 y.o. male with a personal history of more than 25 colon polyps and a family history of colon polyps, breast cancer, and colon cancer, which is somewhat suggestive of a hereditary cancer/polyposis syndrome and predisposition to cancer. We, therefore, discussed and recommended the following at today's visit.   DISCUSSION: We discussed that polyps in general are common, however, most people have fewer than 5 lifetime polyps.  When an individual has 10 or more polyps we become concerned about an underlying polyposis syndrome.  The most common hereditary polyposis syndromes are Familial Adenomatous Polyposis (FAP), caused by mutations in the APC gene, and MUTYH-Associated Polyposis (MAP), caused by mutations in the MUTYH gene.  There are other genes that are associated with polyposis, such as NTHL1 and MSH3.  We discussed that testing is beneficial for several reasons, including knowing about cancer risks, identifying potential screening and risk-reduction options that may be appropriate, and to understand if other family members could be at risk for colon polyps and/or cancer and allow them to undergo genetic testing.  We reviewed the characteristics, features and inheritance patterns of hereditary polyposis/cancer syndromes. We also discussed genetic testing, including the appropriate family members to test, the process of testing, insurance coverage and turn-around-time for results. We discussed the implications of a negative, positive and/or variant of uncertain significant  result. We recommended Mr. Hoffer pursue genetic testing for the [facility] CancerNext-Expanded + RNAinsight gene panel.   The CancerNext-Expanded + RNAinsight gene panel offered by [facility] and includes sequencing and rearrangement analysis for the following 77 genes: AIP, ALK, APC*, ATM*, AXIN2, BAP1, BARD1, BLM, BMPR1A, BRCA1*, BRCA2*, BRIP1*, CDC73, CDH1*, CDK4, CDKN1B, CDKN2A, CHEK2*, CTNNA1, DICER1, FANCC, FH, FLCN, GALNT12, KIF1B, LZTR1, MAX, MEN1, MET, MLH1*, MSH2*, MSH3, MSH6*, MUTYH*, NBN, NF1*, NF2, NTHL1, PALB2*, PHOX2B, PMS2*, POT1, PRKAR1A, PTCH1, PTEN*, RAD51C*, RAD51D*, RB1, RECQL, RET, SDHA, SDHAF2, SDHB, SDHC, SDHD, SMAD4, SMARCA4, SMARCB1, SMARCE1, STK11, SUFU, TMEM127, TP53*, TSC1, TSC2, VHL and XRCC2 (sequencing and deletion/duplication); EGFR, EGLN1, HOXB13, KIT, MITF, PDGFRA, POLD1 and POLE (sequencing only); EPCAM and GREM1 (deletion/duplication only). DNA and RNA analyses performed for * genes.   Based on Mr. Freeland's personal history of >20 adenomatous colon polyps, he meets medical criteria for genetic testing. Despite that he meets criteria, he may still have an out of pocket cost. We discussed that if his out of pocket cost for testing is over $100, the laboratory will call and confirm whether he wants to proceed with testing.  If the out of pocket cost of testing is less than $100 he will be billed by the genetic testing laboratory.   Lastly, we discussed that some people do not want to undergo genetic testing due to fear of genetic discrimination.  A federal law called the Genetic Information Non-Discrimination Act (GINA) of 2008 helps protect individuals against genetic discrimination based on their genetic test results.  It impacts both health insurance and employment.  With health insurance, it protects against increased premiums, being kicked off insurance or being forced to take a test in order to be insured.  For employment it protects against hiring, firing and promoting  decisions based on genetic test results.  Health status due to a cancer diagnosis is not protected under GINA.  Additionally, life, disability, and long-term care insurance is not protected under GINA.   PLAN: After considering the risks, benefits, and limitations, Mr. Cohron  provided informed consent to pursue genetic testing and the blood sample was sent to [facility] for analysis of the CancerNext-Expanded + RNAinsight. Results should be available within approximately two-three weeks' time, at which point they will be disclosed by telephone to Mr. Rezek, as will any additional recommendations warranted by these results. Mr. Longmore will receive a summary of his genetic counseling visit and a copy of his results once available. This information will also be available in Epic.   Mr. Cossey's questions were answered to his satisfaction today. Our contact information was provided should additional questions or concerns arise. Thank you for the referral and allowing [place] to share in the care of your patient.   Clint Guy, [place], [facility] Licensed, Certified Dispensing [email or web address] Phone: [phone]  The patient was seen for a total of 40 minutes in face-to-face genetic counseling.  This patient was discussed with Drs. Magrinat, Lindi Adie and/or Burr Medico who agrees with the above.    _______________________________________________________________________ For Office Staff:  Number of people involved in session: 1 Was an Intern/ student involved with case: no

## 2020-03-18 ENCOUNTER — Ambulatory Visit: Payer: Self-pay | Admitting: Genetic Counselor

## 2020-03-18 ENCOUNTER — Telehealth: Payer: Self-pay | Admitting: Genetic Counselor

## 2020-03-18 DIAGNOSIS — Z1379 Encounter for other screening for genetic and chromosomal anomalies: Secondary | ICD-10-CM

## 2020-03-18 NOTE — Telephone Encounter (Signed)
LVM that his genetic test results are available and requested that he call back to discuss them.  

## 2020-03-18 NOTE — Telephone Encounter (Signed)
Jon King's wife, Jon King, called back to discuss his genetic test results. Revealed negative genetic testing. Discussed that we do not know why he has had many colon polyps or why there is cancer/colon polyps in the family. It is possible that there could be a mutation in a different gene that we are not testing, or our current technology may not be able detect certain mutations. It will therefore be important for [name] to stay in contact with genetics to keep up with whether additional testing may be appropriate in the future.

## 2020-03-22 NOTE — Progress Notes (Signed)
HPI:  Mr. Gadd was previously seen in the [place] clinic due to a personal history of colon polyps and concerns regarding a hereditary predisposition to cancer. Please refer to our prior cancer genetics clinic note for more information regarding our discussion, assessment and recommendations, at the time. Mr. Perra's recent genetic test results were disclosed to him, as were recommendations warranted by these results. These results and recommendations are discussed in more detail below.  FAMILY HISTORY:  We obtained a detailed, 4-generation family history.  Significant diagnoses are listed below: Family History  Problem Relation Age of Onset  . Lung cancer Father 24  . Lung cancer Sister 11  . Colon polyps Sister        more than 10  . Breast cancer Mother 77  . Asthma Paternal Grandfather   . Breast cancer Other        diagnosed in her early 79s  . Cancer Paternal Uncle        diagnosed younger than 29, unknown type (3 paternal uncles)  . Cancer Cousin        diagnosed younger than 80, unknown type (paternal cousin)  . Colon cancer Other        mother's cousin; positive genetic testing in a "cancer gene"  . Esophageal cancer Neg Hx   . Rectal cancer Neg Hx   . Stomach cancer Neg Hx     Mr. Schollmeyer has one son (age 34) and one daughter (age 76). He has one sister, Zorita Pang, who has a history of lung cancer diagnosed around the age of 33 and more than 87 colon polyps. His sister had genetic testing in 2017 which was normal and revealed a VUS in the ATM gene called [id number].  Mr. Sakai's mother is in her 64s and has a history of breast cancer diagnosed in her 72s. His mother has not had any colon polyps. Mr. Hoying has one maternal aunt who is in her 66s and has not had cancer. His maternal grandparents died in their 36s and did not have cancer. He has a maternal great-aunt (grandfather's sister) who had breast cancer in her early 59s. He also has a maternal  cousin once-removed (mother's cousin) who had colon cancer and genetic testing that revealed a mutation in a "cancer gene". There are no other known diagnoses of cancer or colon polyps on the maternal side of the family.  Mr. Ellery's father died at the age of 55 from lung cancer that had been diagnosed at the age of 6. Mr. Arrants had five paternal uncles and two paternal aunts. Three of his paternal uncles died from an unknown cancer when they were younger than 23. One of his paternal cousins also died from an unknown cancer when she was younger than 89. His paternal grandparents died when they were in their 55s or 42s. There are no other known diagnoses of cancer or colon polyps on the paternal side of the family, although Mr. Wymore notes he has relatively limited information about his paternal family history.   Mr. Malena is aware of previous family history of genetic testing for hereditary cancer risks in his sister (normal) and in a fourth degree relative (abnormal - records not available for review). His ancestors are of [place] and unknown descent. There is no reported Ashkenazi Jewish ancestry. There is no known consanguinity.  GENETIC TEST RESULTS: Genetic testing reported out on 03/12/2020 through the [facility] CancerNext-Expanded + RNAinsight panel. No pathogenic variants were detected.  The CancerNext-Expanded + RNAinsight gene panel offered by [employer] and includes sequencing and rearrangement analysis for the following 77 genes: AIP, ALK, APC*, ATM*, AXIN2, BAP1, BARD1, BLM, BMPR1A, BRCA1*, BRCA2*, BRIP1*, CDC73, CDH1*, CDK4, CDKN1B, CDKN2A, CHEK2*, CTNNA1, DICER1, FANCC, FH, FLCN, GALNT12, KIF1B, LZTR1, MAX, MEN1, MET, MLH1*, MSH2*, MSH3, MSH6*, MUTYH*, NBN, NF1*, NF2, NTHL1, PALB2*, PHOX2B, PMS2*, POT1, PRKAR1A, PTCH1, PTEN*, RAD51C*, RAD51D*, RB1, RECQL, RET, SDHA, SDHAF2, SDHB, SDHC, SDHD, SMAD4, SMARCA4, SMARCB1, SMARCE1, STK11, SUFU, TMEM127, TP53*, TSC1, TSC2, VHL and XRCC2  (sequencing and deletion/duplication); EGFR, EGLN1, HOXB13, KIT, MITF, PDGFRA, POLD1 and POLE (sequencing only); EPCAM and GREM1 (deletion/duplication only). DNA and RNA analyses performed for * genes. The test report will be scanned into EPIC and located under the Molecular Pathology section of the Results Review tab.  A portion of the report is included below for reference.     We discussed with Mr. Beckstrand that because current genetic testing is not perfect, it is possible there may be a gene mutation in one of these genes that current testing cannot detect, but that chance is small.  We also discussed, that there could be another gene that has not yet been discovered, or that we have not yet tested, that is responsible for the colon polyps and/or cancer diagnoses in the family. It is also possible there is a hereditary cause for the cancer in the family that Mr. Karapetian did not inherit and therefore was not identified in his testing. Therefore, it is important to remain in touch with cancer genetics in the future so that we can continue to offer Mr. Ohanesian the most up to date genetic testing.   CANCER SCREENING RECOMMENDATIONS: Mr. Soy's test result is considered negative (normal). This means that we have not identified a hereditary cause for his personal and family history of colon polyps/cancer at this time. While reassuring, this does not definitively rule out a hereditary predisposition to cancer. It is still possible that there could be genetic mutations that are undetectable by current technology. There could be genetic mutations in genes that have not been tested or identified to increase cancer risk. Therefore, it is recommended he continue to follow the cancer management and screening guidelines provided by his GI and primary healthcare providers.   An individual's cancer risk and medical management are not determined by genetic test results alone. Overall cancer risk assessment incorporates  additional factors, including personal medical history, family history, and any available genetic information that may result in a personalized plan for cancer prevention and surveillance.  This negative genetic test simply tells [place] that we cannot yet define why Mr. Tory has had an increased number of colorectal polyps. Mr. Poblete's medical management and screening should be based on the prospect that he will likely form more colon polyps in the future and should, therefore, undergo more frequent colonoscopy screening. According to NCCN Guidelines (Genetic/Familial High-Risk Assessment: Colorectal Version 1.2020), individuals who have had more than 20 but less than 100 adenomas and normal genetic testing should have a colonoscopy and polypectomy every 1-2 years. These individuals may also consider a baseline upper endoscopy (including complete visualization of the ampulla of Vater) and repeat as indicated.  RECOMMENDATIONS FOR FAMILY MEMBERS:  Individuals in this family might be at some increased risk of developing cancer, over the general population risk, simply due to the family history of cancer. We recommend women in this family have a yearly mammogram beginning at age 67, or 58 years younger than the earliest  onset of cancer, an annual clinical breast exam, and perform monthly breast self-exams. Women in this family should also have a gynecological exam as recommended by their primary provider.   According to NCCN Guidelines (Genetic/Familial High-Risk Assessment: Colorectal Version 1.2020), all first-degree relatives of Mr. Fellman should have colonoscopies beginning in their late teens, then every 2 years. Initial initiation age and frequency of colonoscopy may be modified based on clinical judgment.   FOLLOW-UP: Lastly, we discussed with Mr. Baldi that cancer genetics is a rapidly advancing field and it is possible that new genetic tests will be appropriate for him and/or his family members in the  future. We encouraged him to remain in contact with cancer genetics on an annual basis so we can update his personal and family histories and let him know of advances in cancer genetics that may benefit this family.   Our contact number was provided. Mr. Fauteux's questions were answered to his satisfaction, and he knows he is welcome to call us at anytime with additional questions or concerns.   Clint Guy, MS, [facility] Genetic Counselor [email or web address] Phone: [phone]

## 2020-06-05 ENCOUNTER — Encounter: Payer: Self-pay | Admitting: Internal Medicine

## 2020-06-05 ENCOUNTER — Telehealth (INDEPENDENT_AMBULATORY_CARE_PROVIDER_SITE_OTHER): Payer: 59 | Admitting: Internal Medicine

## 2020-06-05 DIAGNOSIS — M542 Cervicalgia: Secondary | ICD-10-CM | POA: Diagnosis not present

## 2020-06-05 MED ORDER — GABAPENTIN 300 MG PO CAPS: 300.0000 mg | ORAL_CAPSULE | Freq: Three times a day (TID) | ORAL | 0 refills | Status: DC

## 2020-06-05 NOTE — Progress Notes (Signed)
Virtual Visit via Video Note  I connected with Idanha on 06/05/20 at  9:00 AM EDT by a video enabled telemedicine application and verified that I am speaking with the correct person using two identifiers.  The patient and the provider were at separate locations throughout the entire encounter. Patient location: home, Provider location: work   I discussed the limitations of evaluation and management by telemedicine and the availability of in person appointments. The patient expressed understanding and agreed to proceed. The patient and the provider were the only parties present for the visit unless noted in HPI below.  History of Present Illness: The patient is a 54 y.o. man with visit for neck pain and some headaches associated. Has had in the past last 2014 and was taking gabapentin for it then which helped. Ended up seeing neurology and extensive workup indicated that the headaches were from neck problems. Started around June and gradually becoming more often. Sometimes the headache will come up throughout the day. No numbness or weakness down arms. Denies injury or overuse.   Observations/Objective: Appearance: normal, some pain to self palpation neck paraspinal and spinal, breathing appears normal, casual grooming, abdomen does not appear distended, memory normal, mental status is A and O times 3  Assessment and Plan: See problem oriented charting  Follow Up Instructions: x-ray cervical and rx gabapentin  I discussed the assessment and treatment plan with the patient. The patient was provided an opportunity to ask questions and all were answered. The patient agreed with the plan and demonstrated an understanding of the instructions.   The patient was advised to call back or seek an in-person evaluation if the symptoms worsen or if the condition fails to improve as anticipated.  Hoyt Koch, MD

## 2020-06-05 NOTE — Assessment & Plan Note (Signed)
X-ray cervical ordered and rx gabapentin 300 mg TID prn for pain.

## 2020-06-06 ENCOUNTER — Ambulatory Visit (INDEPENDENT_AMBULATORY_CARE_PROVIDER_SITE_OTHER): Payer: 59

## 2020-06-06 DIAGNOSIS — M542 Cervicalgia: Secondary | ICD-10-CM | POA: Diagnosis not present

## 2020-07-07 ENCOUNTER — Other Ambulatory Visit: Payer: Self-pay | Admitting: Internal Medicine

## 2020-07-17 ENCOUNTER — Telehealth: Payer: Self-pay | Admitting: Internal Medicine

## 2020-07-17 NOTE — Telephone Encounter (Signed)
Patient is requesting lab results from x-ray done on 8.12.21. Patient is still experiencing neck pain

## 2020-07-19 NOTE — Telephone Encounter (Signed)
Cervical spine x-ray showed minimal arthritis.  Please see me for an office visit. Thanks

## 2020-07-19 NOTE — Telephone Encounter (Signed)
Patient will call back and schedule appointment with Dr. Alain Marion.

## 2020-08-25 ENCOUNTER — Other Ambulatory Visit: Payer: Self-pay | Admitting: Internal Medicine

## 2020-10-02 ENCOUNTER — Other Ambulatory Visit: Payer: Self-pay

## 2020-10-02 ENCOUNTER — Encounter: Payer: Self-pay | Admitting: Internal Medicine

## 2020-10-02 ENCOUNTER — Ambulatory Visit (INDEPENDENT_AMBULATORY_CARE_PROVIDER_SITE_OTHER): Payer: 59 | Admitting: Internal Medicine

## 2020-10-02 VITALS — BP 120/78 | HR 105 | Temp 98.6°F | Ht 71.0 in | Wt 237.6 lb

## 2020-10-02 DIAGNOSIS — D126 Benign neoplasm of colon, unspecified: Secondary | ICD-10-CM | POA: Diagnosis not present

## 2020-10-02 DIAGNOSIS — R42 Dizziness and giddiness: Secondary | ICD-10-CM | POA: Insufficient documentation

## 2020-10-02 DIAGNOSIS — Z0000 Encounter for general adult medical examination without abnormal findings: Secondary | ICD-10-CM

## 2020-10-02 LAB — URINALYSIS
Bilirubin Urine: NEGATIVE
Ketones, ur: NEGATIVE
Leukocytes,Ua: NEGATIVE
Nitrite: NEGATIVE
Specific Gravity, Urine: >=1.03 — AB (ref 1.000–1.030)
Total Protein, Urine: NEGATIVE
Urine Glucose: NEGATIVE
Urobilinogen, UA: 0.2 (ref 0.0–1.0)
pH: 5.5 (ref 5.0–8.0)

## 2020-10-02 MED ORDER — LISINOPRIL-HYDROCHLOROTHIAZIDE 10-12.5 MG PO TABS
1.0000 | ORAL_TABLET | Freq: Every day | ORAL | 3 refills | Status: DC
Start: 2020-10-02 — End: 2021-11-03

## 2020-10-02 MED ORDER — VITAMIN D 25 MCG (1000 UNIT) PO TABS: 1000.0000 [IU] | ORAL_TABLET | Freq: Every day | ORAL | 11 refills | Status: AC

## 2020-10-02 MED ORDER — GABAPENTIN 300 MG PO CAPS
300.0000 mg | ORAL_CAPSULE | Freq: Three times a day (TID) | ORAL | 3 refills | Status: DC
Start: 2020-10-02 — End: 2021-11-03

## 2020-10-02 MED ORDER — MECLIZINE HCL 12.5 MG PO TABS: 12.5000 mg | ORAL_TABLET | Freq: Three times a day (TID) | ORAL | 1 refills | Status: AC | PRN

## 2020-10-02 NOTE — Patient Instructions (Signed)
Benign Positional Vertigo symptoms on the left. Start Meclizine. Start Brandt - Daroff exercise several times a day as dirrected. 

## 2020-10-02 NOTE — Assessment & Plan Note (Signed)
Colon: Dr Hilarie Fredrickson  2021 and Jan 2022

## 2020-10-02 NOTE — Assessment & Plan Note (Signed)
We discussed age appropriate health related issues, including available/recomended screening tests and vaccinations. We discussed a need for adhering to healthy diet and exercise. Labs were ordered to be later reviewed . All questions were answered. Shingrix advised 2019 Coronary calcium score of 0. Wt loss Colon due in Jan 2022

## 2020-10-02 NOTE — Progress Notes (Signed)
Subjective:  Patient ID: Collier Flowers, male    DOB: 1966/07/22  Age: 54 y.o. MRN: [id number]  CC: Annual Exam   HPI Devlon A Houseworth presents for a well exam F/u polyps C/o vertigo  Outpatient Medications Prior to Visit  Medication Sig Dispense Refill  . gabapentin (NEURONTIN) 300 MG capsule TAKE 1 CAPSULE BY MOUTH THREE TIMES DAILY 90 capsule 0  . lisinopril-hydrochlorothiazide (ZESTORETIC) 10-12.5 MG tablet Take 1 tablet by mouth once daily 90 tablet 2  . Multiple Vitamin (MULTIVITAMIN) tablet Take 1 tablet by mouth daily.    . cyclobenzaprine (FLEXERIL) 10 MG tablet Take 1 tablet (10 mg total) by mouth at bedtime. (Patient not taking: Reported on 12/06/2019) 30 tablet 0   No facility-administered medications prior to visit.    ROS: Review of Systems  Objective:  BP 120/78 (BP Location: Left Arm)   Pulse (!) 105   Temp 98.6 F (37 C) (Oral)   Ht 5\' 11"  (1.803 m)   Wt 237 lb 9.6 oz (107.8 kg)   SpO2 94%   BMI 33.14 kg/m   BP Readings from Last 3 Encounters:  10/02/20 120/78  01/30/20 122/83  12/05/19 122/84    Wt Readings from Last 3 Encounters:  10/02/20 237 lb 9.6 oz (107.8 kg)  01/30/20 240 lb (108.9 kg)  12/06/19 240 lb (108.9 kg)    Physical Exam  Lab Results  Component Value Date   WBC 9.5 10/02/2019   HGB 15.1 10/02/2019   HCT 43.6 10/02/2019   PLT 228.0 10/02/2019   GLUCOSE 93 10/02/2019   CHOL 180 10/02/2019   TRIG (H) 10/02/2019    412.0 Triglyceride is over 400; calculations on Lipids are invalid.   HDL 36.90 (L) 10/02/2019   LDLDIRECT 92.0 10/02/2019   ALT 23 10/02/2019   AST 16 10/02/2019   NA 142 10/02/2019   K 4.0 10/02/2019   CL 104 10/02/2019   CREATININE 0.91 10/02/2019   BUN 13 10/02/2019   CO2 29 10/02/2019   TSH 1.99 10/02/2019   PSA 0.53 10/02/2019    CT CARDIAC SCORING  Addendum Date: 10/24/2018   ADDENDUM REPORT: 10/24/2018 13:57 CLINICAL DATA:  Risk stratification EXAM: Coronary Calcium Score TECHNIQUE: The patient  was scanned on a Siemens Somatom 64 slice scanner. Axial non-contrast 3 mm slices were carried out through the heart. The data set was analyzed on a dedicated work station and scored using the [place]. FINDINGS: Non-cardiac: See separate report from [facility]. Ascending aorta: Normal diameter 3.4 Pericardium: Normal Coronary arteries: No calcium noted IMPRESSION: Coronary calcium score of 0. [name] Electronically Signed   By: [name] M.D.   On: 10/24/2018 13:57   Result Date: 10/24/2018 EXAM: OVER-READ INTERPRETATION  CT CHEST The following report is an over-read performed by radiologist Dr. Markus Daft of [facility], [place] on 10/24/2018. This over-read does not include interpretation of cardiac or coronary anatomy or pathology. The coronary calcium score/coronary CTA interpretation by the cardiologist is attached. COMPARISON:  None. FINDINGS: Vascular: Normal caliber of the visualized thoracic aorta without atherosclerotic calcifications. Heart size is normal with minimal pericardial fluid. Normal caliber of the main pulmonary arteries. Mediastinum/Nodes: Visualized mediastinal and hilar tissue is unremarkable. Lungs/Pleura: No pleural effusions. 4 mm peripheral nodular density in the right middle lobe on sequence 5 image 24. There is concern for mild centrilobular emphysema. Mild scarring or atelectasis along the medial right middle lobe. No large areas of airspace disease or consolidation in the visualized lungs.  Focal thickening along the right minor fissure on sequence 5, image 13. Upper Abdomen: Images of the upper abdomen are unremarkable. Musculoskeletal: Visualized bone structures are unremarkable. IMPRESSION: 1. No acute findings. 2. Concern for mild centrilobular emphysema. 3. Indeterminate 4 mm nodule in the right middle lobe. No follow-up needed if patient is low-risk. Non-contrast chest CT can be considered in 12 months if patient is high-risk. This recommendation  follows the consensus statement: Guidelines for Management of Incidental Pulmonary Nodules Detected on CT Images: From the Fleischner Society 2017; Radiology 2017; 284:228-243. Electronically Signed: By: Markus Daft M.D. On: 10/24/2018 08:59    Assessment & Plan:    Walker Kehr, MD

## 2020-10-02 NOTE — Assessment & Plan Note (Signed)
Benign Positional Vertigo symptoms on the left. Start Meclizine. Start Brandt - Daroff exercise several times a day as dirrected. 

## 2020-10-03 LAB — LIPID PANEL
Cholesterol: 186 mg/dL (ref 0–200)
HDL: 31 mg/dL — ABNORMAL LOW (ref >39.00)
Total CHOL/HDL Ratio: 6
Triglycerides: 471 mg/dL — ABNORMAL HIGH (ref 0.0–149.0)

## 2020-10-03 LAB — COMPREHENSIVE METABOLIC PANEL
ALT: 27 U/L (ref 0–53)
AST: 19 U/L (ref 0–37)
Albumin: 4.4 g/dL (ref 3.5–5.2)
Alkaline Phosphatase: 63 U/L (ref 39–117)
BUN: 21 mg/dL (ref 6–23)
CO2: 29 mEq/L (ref 19–32)
Calcium: 9.3 mg/dL (ref 8.4–10.5)
Chloride: 104 mEq/L (ref 96–112)
Creatinine, Ser: 0.92 mg/dL (ref 0.40–1.50)
GFR: 94.38 mL/min (ref >60.00)
Glucose, Bld: 86 mg/dL (ref 70–99)
Potassium: 4 mEq/L (ref 3.5–5.1)
Sodium: 141 mEq/L (ref 135–145)
Total Bilirubin: 0.7 mg/dL (ref 0.2–1.2)
Total Protein: 7.1 g/dL (ref 6.0–8.3)

## 2020-10-03 LAB — CBC WITH DIFFERENTIAL/PLATELET
Basophils Absolute: 0.2 10*3/uL — ABNORMAL HIGH (ref 0.0–0.1)
Basophils Relative: 1.8 % (ref 0.0–3.0)
Eosinophils Absolute: 0.2 10*3/uL (ref 0.0–0.7)
Eosinophils Relative: 2.1 % (ref 0.0–5.0)
HCT: 44.4 % (ref 39.0–52.0)
Hemoglobin: 15.1 g/dL (ref 13.0–17.0)
Lymphocytes Relative: 24.3 % (ref 12.0–46.0)
Lymphs Abs: 2.5 10*3/uL (ref 0.7–4.0)
MCHC: 33.9 g/dL (ref 30.0–36.0)
MCV: 84 fl (ref 78.0–100.0)
Monocytes Absolute: 1.1 10*3/uL — ABNORMAL HIGH (ref 0.1–1.0)
Monocytes Relative: 10.2 % (ref 3.0–12.0)
Neutro Abs: 6.4 10*3/uL (ref 1.4–7.7)
Neutrophils Relative %: 61.6 % (ref 43.0–77.0)
Platelets: 244 10*3/uL (ref 150.0–400.0)
RBC: 5.28 Mil/uL (ref 4.22–5.81)
RDW: 13.7 % (ref 11.5–15.5)
WBC: 10.3 10*3/uL (ref 4.0–10.5)

## 2020-10-03 LAB — PSA: PSA: 0.49 ng/mL (ref 0.10–4.00)

## 2020-10-03 LAB — TSH: TSH: 2.17 u[IU]/mL (ref 0.35–4.50)

## 2020-10-03 LAB — LDL CHOLESTEROL, DIRECT: Direct LDL: 97 mg/dL

## 2020-10-09 ENCOUNTER — Encounter: Payer: Self-pay | Admitting: Internal Medicine

## 2020-11-12 DIAGNOSIS — U071 COVID-19: Secondary | ICD-10-CM | POA: Diagnosis not present

## 2020-11-12 DIAGNOSIS — R509 Fever, unspecified: Secondary | ICD-10-CM | POA: Diagnosis not present

## 2020-11-14 DIAGNOSIS — R0982 Postnasal drip: Secondary | ICD-10-CM | POA: Diagnosis not present

## 2020-11-14 DIAGNOSIS — U071 COVID-19: Secondary | ICD-10-CM | POA: Diagnosis not present

## 2020-11-27 DIAGNOSIS — R509 Fever, unspecified: Secondary | ICD-10-CM | POA: Diagnosis not present

## 2020-11-27 DIAGNOSIS — R051 Acute cough: Secondary | ICD-10-CM | POA: Diagnosis not present

## 2020-11-27 DIAGNOSIS — R0602 Shortness of breath: Secondary | ICD-10-CM | POA: Diagnosis not present

## 2020-12-04 ENCOUNTER — Telehealth: Payer: Self-pay | Admitting: Internal Medicine

## 2020-12-04 NOTE — Telephone Encounter (Signed)
States pt has some external hemorrhoids that are bothering him, she wanted to know what Dr. Hilarie Fredrickson can do for these. Discussed they would be treated medically, if he is wanting them removed this has to be done by a Psychologist, sport and exercise. Banding can only be done to internal hems.

## 2020-12-04 NOTE — Telephone Encounter (Signed)
Pt's spouse is requesting a call back from a nurse to discuss the pt's external hemorrhoids.

## 2020-12-07 DIAGNOSIS — G4733 Obstructive sleep apnea (adult) (pediatric): Secondary | ICD-10-CM | POA: Diagnosis not present

## 2021-02-04 DIAGNOSIS — G4733 Obstructive sleep apnea (adult) (pediatric): Secondary | ICD-10-CM | POA: Diagnosis not present

## 2021-02-10 ENCOUNTER — Encounter: Payer: Self-pay | Admitting: Internal Medicine

## 2021-02-10 ENCOUNTER — Ambulatory Visit (INDEPENDENT_AMBULATORY_CARE_PROVIDER_SITE_OTHER): Payer: BC Managed Care – PPO | Admitting: Internal Medicine

## 2021-02-10 VITALS — BP 120/78 | HR 104 | Ht 71.0 in | Wt 247.4 lb

## 2021-02-10 DIAGNOSIS — K648 Other hemorrhoids: Secondary | ICD-10-CM

## 2021-02-10 DIAGNOSIS — K580 Irritable bowel syndrome with diarrhea: Secondary | ICD-10-CM

## 2021-02-10 DIAGNOSIS — Z86010 Personal history of colonic polyps: Secondary | ICD-10-CM

## 2021-02-10 DIAGNOSIS — K602 Anal fissure, unspecified: Secondary | ICD-10-CM | POA: Diagnosis not present

## 2021-02-10 MED ORDER — DILTIAZEM GEL 2 %: CUTANEOUS | 0 refills | Status: AC

## 2021-02-10 MED ORDER — RIFAXIMIN 550 MG PO TABS: 550.0000 mg | ORAL_TABLET | Freq: Three times a day (TID) | ORAL | 0 refills | Status: AC

## 2021-02-10 MED ORDER — SUTAB 1479-225-188 MG PO TABS: ORAL_TABLET | ORAL | 0 refills | Status: DC

## 2021-02-10 MED ORDER — RIFAXIMIN 550 MG PO TABS: 550.0000 mg | ORAL_TABLET | Freq: Three times a day (TID) | ORAL | 0 refills | Status: DC

## 2021-02-10 NOTE — Patient Instructions (Addendum)
You have been scheduled for a colonoscopy. Please follow written instructions given to you at your visit today.  Please pick up your prep supplies at the pharmacy within the next 1-3 days. If you use inhalers (even only as needed), please bring them with you on the day of your procedure.  If you are age 55 or younger, your body mass index should be between 19-25. Your Body mass index is 34.51 kg/m. If this is out of the aformentioned range listed, please consider follow up with your Primary Care Provider.   Due to recent changes in healthcare laws, you may see the results of your imaging and laboratory studies on MyChart before your provider has had a chance to review them.  We understand that in some cases there may be results that are confusing or concerning to you. Not all laboratory results come back in the same time frame and the provider may be waiting for multiple results in order to interpret others.  Please give [place] 48 hours in order for your provider to thoroughly review all the results before contacting the office for clarification of your results.    We have sent the following medications to your pharmacy for you to pick up at your convenience: Xifaxan 550 mg: Take Three times a day for 14 days  __________________________________________________________  We have sent a prescription for Diltiazem gel 2% to [facility].  Apply 2 times a day as needed.   [facility]'s information is below: Address: [address] [place], [place] [address]  [phone]  *Please DO NOT go directly from our office to pick up this medication! Give the pharmacy 1 day to process the prescription as this is compounded and takes time to make.  You can also use Recticare as needed.  We provided you with some sample today of Recticare. You can buy more over the counter as needed.   Thank you for entrusting me with your care and for choosing [facility], Dr. Zenovia Jarred

## 2021-02-10 NOTE — Progress Notes (Signed)
Patient ID: Jon King, male   DOB: 01-13-1966, 55 y.o.   MRN: [id number] HPI: Jon King is a 55 year old male with history of multiple adenomatous colon polyps, family history of colon cancer who is seen in follow-up to discuss his surveillance colonoscopy but also to discuss symptomatic hemorrhoids.  He is here alone today.  He is known to me from his colonoscopy which I performed on 01/30/2020.  I removed 12 polyps ranging in size from 4 to 15 mm.  10 of these polyps were adenomatous, 2 were hyperplastic.  I sent him to medical genetics to exclude polyposis syndrome.  His medical genetics blood screening panel was negative.  He reports that he has been doing well though he has been having trouble with intermittent loose stools and diarrhea.  This has been an off-and-on problem for him for a long time.  He states it is definitely worse if he does not pay attention to a healthy diet.  When he eats a keto style diet and reduces his carbohydrate intake his diarrhea seems to resolve.  When he has diarrhea for multiple days he will often have anal pain with passing stool with bleeding.  He does not have painless bleeding.  He does have some bulging perianal tissue at times which he feels is hemorrhoidal.  This does not hurt.  During flares of his diarrhea he will have 5 or 6 loose watery urgent stools per day and when his bowel habits are more normal he will have 2 formed stools a day.  Past Medical History:  Diagnosis Date  . Allergy   . Degenerative disc disease, cervical   . Family history of breast cancer   . Family history of colon cancer   . Family history of colonic polyps   . Family history of gene mutation   . Family history of lung cancer   . Headache(784.0)    orginate from nerve thing in head  . History of kidney stones    last stone nov 2015  . Hypertension   . Skin rash    right should road rash healing no drainage  . Sleep apnea    pending home test    Past Surgical History:   Procedure Laterality Date  . COLONOSCOPY  2010, 2011,05/13/15  . COLONOSCOPY WITH PROPOFOL N/A 05/13/2015   Procedure: COLONOSCOPY WITH PROPOFOL;  Surgeon: Garlan Fair, MD;  Location: [facility];  Service: Endoscopy;  Laterality: N/A;  . LITHOTRIPSY  2014    Outpatient Medications Prior to Visit  Medication Sig Dispense Refill  . cholecalciferol (VITAMIN D3) 25 MCG (1000 UNIT) tablet Take 1 tablet (1,000 Units total) by mouth daily. 30 tablet 11  . gabapentin (NEURONTIN) 300 MG capsule Take 1 capsule (300 mg total) by mouth 3 (three) times daily. 270 capsule 3  . lisinopril-hydrochlorothiazide (ZESTORETIC) 10-12.5 MG tablet Take 1 tablet by mouth daily. 90 tablet 3  . meclizine (ANTIVERT) 12.5 MG tablet Take 1 tablet (12.5 mg total) by mouth 3 (three) times daily as needed for dizziness. 60 tablet 1  . Multiple Vitamin (MULTIVITAMIN) tablet Take 1 tablet by mouth daily.     No facility-administered medications prior to visit.    No Known Allergies  Family History  Problem Relation Age of Onset  . Lung cancer Father 65  . Lung cancer Sister 65  . Colon polyps Sister        more than 10  . Breast cancer Mother 18  . Asthma Paternal Grandfather   .  Breast cancer Other        diagnosed in her early 38s  . Cancer Paternal Uncle        diagnosed younger than 54, unknown type (3 paternal uncles)  . Cancer Cousin        diagnosed younger than 17, unknown type (paternal cousin)  . Colon cancer Other        mother's cousin; positive genetic testing in a "cancer gene"  . Esophageal cancer Neg Hx   . Rectal cancer Neg Hx   . Stomach cancer Neg Hx     Social History   Tobacco Use  . Smoking status: Former Smoker    Packs/day: 1.00    Years: 20.00    Pack years: 20.00    Quit date: 09/11/2001    Years since quitting: 19.4  . Smokeless tobacco: Never Used  Vaping Use  . Vaping Use: Never used  Substance Use Topics  . Alcohol use: Yes    Comment: rare  . Drug use: No     ROS: As per history of present illness, otherwise negative  BP 120/78   Pulse (!) 104   Ht 5\' 11"  (1.803 m)   Wt 247 lb 6.4 oz (112.2 kg)   SpO2 95%   BMI 34.51 kg/m  Gen: awake, alert, NAD HEENT: anicteric, op clear Abd: soft, NT/ND, +BS throughout Rectal: External exam only, small perianal skin tag, visible right and left grade 2 internal hemorrhoids, no external hemorrhoids seen Ext: no c/c/e Neuro: nonfocal   RELEVANT LABS AND IMAGING: CBC    Component Value Date/Time   WBC 10.3 10/02/2020 1609   RBC 5.28 10/02/2020 1609   HGB 15.1 10/02/2020 1609   HCT 44.4 10/02/2020 1609   PLT 244.0 10/02/2020 1609   MCV 84.0 10/02/2020 1609   MCHC 33.9 10/02/2020 1609   RDW 13.7 10/02/2020 1609   LYMPHSABS 2.5 10/02/2020 1609   MONOABS 1.1 (H) 10/02/2020 1609   EOSABS 0.2 10/02/2020 1609   BASOSABS 0.2 (H) 10/02/2020 1609    CMP     Component Value Date/Time   NA 141 10/02/2020 1609   K 4.0 10/02/2020 1609   CL 104 10/02/2020 1609   CO2 29 10/02/2020 1609   GLUCOSE 86 10/02/2020 1609   BUN 21 10/02/2020 1609   CREATININE 0.92 10/02/2020 1609   CALCIUM 9.3 10/02/2020 1609   PROT 7.1 10/02/2020 1609   ALBUMIN 4.4 10/02/2020 1609   AST 19 10/02/2020 1609   ALT 27 10/02/2020 1609   ALKPHOS 63 10/02/2020 1609   BILITOT 0.7 10/02/2020 1609    ASSESSMENT/PLAN: 55 year old male with history of multiple adenomatous colon polyps who is seen in follow-up to discuss his surveillance colonoscopy but also to discuss symptomatic hemorrhoids.   1.  History of multiple adenomatous colon polyps --he has had more than 10 lifetime adenomas at age 84.  His sister is also had colon polyps.  His medical genetics evaluation for polyposis syndrome was negative.  Given that he had 10 adenomas removed 1 year ago I recommended surveillance at this time.  We reviewed the risk, benefits and alternatives and he is agreeable and wishes to proceed -- Surveillance colonoscopy in the [place]  2.   Chronic and intermittent diarrhea --certainly may be dietary in nature.  He does better when he avoids carbohydrates and follows a more keto style diet.  Symptoms do sound irritable in nature and I am going to treat him for IBS-D with rifaximin -- Rifaximin 550 mg  3 times daily x14 days  3.  Internal hemorrhoids and anal fissure --we spent time today discussing the difference tween anal fissure and internal hemorrhoids.  He has both of these at varying times.  His internal hemorrhoid symptoms are predominantly intermittent prolapse.  His anal fissure symptoms are pain and bleeding with passing stool when he is having frequent diarrhea.  We discussed how these are treated differently and I recommend the following: -- Diltiazem gel 2% twice daily to 3 times daily -- RectiCare per box instruction for perianal pain -- Working to correct #2 should reduce the risk of recurrent anal fissure -- We discussed hemorrhoidal banding which would help with the prolapse symptoms.  We are going to work on #2 above and also healing anal fissure.  If symptoms of internal hemorrhoids persist then he would be a good candidate for hemorrhoidal banding in the future.      HX:[id number], Evie Lacks, [place],  [place] [address]

## 2021-02-14 ENCOUNTER — Telehealth: Payer: Self-pay

## 2021-02-14 NOTE — Telephone Encounter (Signed)
Called [facility] to check on status of prescription sent for rifaximin 550 mg.  They indicated they have rec'd what they need and are working on the [place]. They will keep [place] posted on the status.

## 2021-02-17 DIAGNOSIS — J309 Allergic rhinitis, unspecified: Secondary | ICD-10-CM | POA: Diagnosis not present

## 2021-02-17 NOTE — Telephone Encounter (Signed)
PA for Xifaxan approved through 02-13-22. [employer] [place] Reference #[id number]. [id number]

## 2021-04-07 ENCOUNTER — Other Ambulatory Visit: Payer: Self-pay

## 2021-04-07 ENCOUNTER — Ambulatory Visit (AMBULATORY_SURGERY_CENTER): Payer: BC Managed Care – PPO | Admitting: Internal Medicine

## 2021-04-07 ENCOUNTER — Encounter: Payer: Self-pay | Admitting: Internal Medicine

## 2021-04-07 VITALS — BP 111/75 | HR 85 | Temp 98.7°F | Resp 13 | Ht 71.0 in | Wt 247.0 lb

## 2021-04-07 DIAGNOSIS — Z86010 Personal history of colonic polyps: Secondary | ICD-10-CM | POA: Diagnosis not present

## 2021-04-07 DIAGNOSIS — D125 Benign neoplasm of sigmoid colon: Secondary | ICD-10-CM

## 2021-04-07 DIAGNOSIS — Z1211 Encounter for screening for malignant neoplasm of colon: Secondary | ICD-10-CM | POA: Diagnosis not present

## 2021-04-07 MED ORDER — SODIUM CHLORIDE 0.9 % IV SOLN: 500.0000 mL | Freq: Once | INTRAVENOUS | Status: DC

## 2021-04-07 NOTE — Progress Notes (Signed)
Report given to PACU, vss 

## 2021-04-07 NOTE — Patient Instructions (Signed)
Handouts on polyps, hemorrhoids, and diverticulosis given to you today  Await pathology results from Dr. Hilarie Fredrickson   YOU HAD AN ENDOSCOPIC PROCEDURE TODAY AT THE [facility]:   Refer to the procedure report that was given to you for any specific questions about what was found during the examination.  If the procedure report does not answer your questions, please call your gastroenterologist to clarify.  If you requested that your care partner not be given the details of your procedure findings, then the procedure report has been included in a sealed envelope for you to review at your convenience later.  YOU SHOULD EXPECT: Some feelings of bloating in the abdomen. Passage of more gas than usual.  Walking can help get rid of the air that was put into your GI tract during the procedure and reduce the bloating. If you had a lower endoscopy (such as a colonoscopy or flexible sigmoidoscopy) you may notice spotting of blood in your stool or on the toilet paper. If you underwent a bowel prep for your procedure, you may not have a normal bowel movement for a few days.  Please Note:  You might notice some irritation and congestion in your nose or some drainage.  This is from the oxygen used during your procedure.  There is no need for concern and it should clear up in a day or so.  SYMPTOMS TO REPORT IMMEDIATELY:  Following lower endoscopy (colonoscopy or flexible sigmoidoscopy):  Excessive amounts of blood in the stool  Significant tenderness or worsening of abdominal pains  Swelling of the abdomen that is new, acute  Fever of 100F or higher   For urgent or emergent issues, a gastroenterologist can be reached at any hour by calling [phone]. Do not use MyChart messaging for urgent concerns.    DIET:  We do recommend a small meal at first, but then you may proceed to your regular diet.  Drink plenty of fluids but you should avoid alcoholic beverages for 24 hours.  ACTIVITY:  You  should plan to take it easy for the rest of today and you should NOT DRIVE or use heavy machinery until tomorrow (because of the sedation medicines used during the test).    FOLLOW UP: Our staff will call the number listed on your records 48-72 hours following your procedure to check on you and address any questions or concerns that you may have regarding the information given to you following your procedure. If we do not reach you, we will leave a message.  We will attempt to reach you two times.  During this call, we will ask if you have developed any symptoms of COVID 19. If you develop any symptoms (ie: fever, flu-like symptoms, shortness of breath, cough etc.) before then, please call [phone].  If you test positive for Covid 19 in the 2 weeks post procedure, please call and report this information to [place].    If any biopsies were taken you will be contacted by phone or by letter within the next 1-3 weeks.  Please call us at [phone] if you have not heard about the biopsies in 3 weeks.    SIGNATURES/CONFIDENTIALITY: You and/or your care partner have signed paperwork which will be entered into your electronic medical record.  These signatures attest to the fact that that the information above on your After Visit Summary has been reviewed and is understood.  Full responsibility of the confidentiality of this discharge information lies with you and/or your care-partner.

## 2021-04-07 NOTE — Progress Notes (Signed)
Vs by CW 

## 2021-04-07 NOTE — Op Note (Signed)
[name] Patient Name: Jon King Procedure Date: 04/07/2021 9:54 AM MRN: [id number] Endoscopist: Jerene Bears , MD Age: 55 Referring MD:  Date of Birth: 26-May-1966 Gender: Male Account #: [id number] Procedure:                Colonoscopy Indications:              High risk colon cancer surveillance: Personal                            history of multiple adenomas (10 at last exam,                            medical genetics eval negative), Last colonoscopy:                            April 2021 Medicines:                Monitored Anesthesia Care Procedure:                Pre-Anesthesia Assessment:                           - Prior to the procedure, a History and Physical                            was performed, and patient medications and                            allergies were reviewed. The patient's tolerance of                            previous anesthesia was also reviewed. The risks                            and benefits of the procedure and the sedation                            options and risks were discussed with the patient.                            All questions were answered, and informed consent                            was obtained. Prior Anticoagulants: The patient has                            taken no previous anticoagulant or antiplatelet                            agents. ASA Grade Assessment: II - A patient with                            mild systemic disease. After reviewing the risks  and benefits, the patient was deemed in                            satisfactory condition to undergo the procedure.                           After obtaining informed consent, the colonoscope                            was passed under direct vision. Throughout the                            procedure, the patient's blood pressure, pulse, and                            oxygen saturations were monitored continuously. The                             Olympus CF-HQ190 [id number]) Colonoscope was                            introduced through the anus and advanced to the                            cecum, identified by appendiceal orifice and                            ileocecal valve. The colonoscopy was performed                            without difficulty. The patient tolerated the                            procedure well. The quality of the bowel                            preparation was good. The ileocecal valve,                            appendiceal orifice, and rectum were photographed. Scope In: 9:59:28 AM Scope Out: 10:14:08 AM Scope Withdrawal Time: 0 hours 13 minutes 3 seconds  Total Procedure Duration: 0 hours 14 minutes 40 seconds  Findings:                 The digital rectal exam was normal.                           Four sessile polyps were found in the sigmoid                            colon. The polyps were 3 to 6 mm in size. These                            polyps were removed with a cold snare. Resection  and retrieval were complete.                           Multiple small-mouthed diverticula were found in                            the sigmoid colon and distal descending colon.                           Internal hemorrhoids were found during                            retroflexion. The hemorrhoids were small. Complications:            No immediate complications. Estimated Blood Loss:     Estimated blood loss was minimal. Impression:               - Four 3 to 6 mm polyps in the sigmoid colon,                            removed with a cold snare. Resected and retrieved.                           - Diverticulosis in the sigmoid colon and in the                            distal descending colon.                           - Internal hemorrhoids. Recommendation:           - Patient has a contact number available for                            emergencies. The signs and  symptoms of potential                            delayed complications were discussed with the                            patient. Return to normal activities tomorrow.                            Written discharge instructions were provided to the                            patient.                           - Resume previous diet.                           - Continue present medications.                           - Await pathology results.                           -  Repeat colonoscopy is recommended for                            surveillance. The colonoscopy date will be                            determined after pathology results from today's                            exam become available for review. Jerene Bears, MD 04/07/2021 10:18:25 AM This report has been signed electronically.

## 2021-04-07 NOTE — Progress Notes (Signed)
Called to room to assist during endoscopic procedure.  Patient ID and intended procedure confirmed with present staff. Received instructions for my participation in the procedure from the performing physician.  

## 2021-04-08 ENCOUNTER — Telehealth: Payer: Self-pay

## 2021-04-08 NOTE — Telephone Encounter (Signed)
-----   Message from Jerene Bears, MD sent at 04/07/2021 10:27 AM EDT ----- Wants banding visits, nonurgent of course Jmp

## 2021-04-08 NOTE — Telephone Encounter (Signed)
Called and spoke to patient.  Scheduled him for first available hemorrhoid banding appointment on 8-16. Added patient to wait list for earlier appointment if available.  Patient not on blood thinners, not allergic to latex.

## 2021-04-09 ENCOUNTER — Telehealth: Payer: Self-pay

## 2021-04-09 NOTE — Telephone Encounter (Signed)
LVM

## 2021-04-13 ENCOUNTER — Encounter: Payer: Self-pay | Admitting: Internal Medicine

## 2021-05-09 ENCOUNTER — Encounter: Payer: Self-pay | Admitting: *Deleted

## 2021-06-10 ENCOUNTER — Ambulatory Visit (INDEPENDENT_AMBULATORY_CARE_PROVIDER_SITE_OTHER): Payer: BC Managed Care – PPO | Admitting: Internal Medicine

## 2021-06-10 ENCOUNTER — Encounter: Payer: Self-pay | Admitting: Internal Medicine

## 2021-06-10 VITALS — BP 110/80 | HR 100 | Ht 68.5 in | Wt 236.4 lb

## 2021-06-10 DIAGNOSIS — K648 Other hemorrhoids: Secondary | ICD-10-CM

## 2021-06-10 MED ORDER — RIFAXIMIN 550 MG PO TABS: 550.0000 mg | ORAL_TABLET | Freq: Three times a day (TID) | ORAL | 0 refills | Status: DC

## 2021-06-10 NOTE — Patient Instructions (Addendum)
HEMORRHOID BANDING PROCEDURE    FOLLOW-UP CARE   The procedure you have had should have been relatively painless since the banding of the area involved does not have nerve endings and there is no pain sensation.  The rubber band cuts off the blood supply to the hemorrhoid and the band may fall off as soon as 48 hours after the banding (the band may occasionally be seen in the toilet bowl following a bowel movement). You may notice a temporary feeling of fullness in the rectum which should respond adequately to plain Tylenol or Motrin.  Following the banding, avoid strenuous exercise that evening and resume full activity the next day.  A sitz bath (soaking in a warm tub) or bidet is soothing, and can be useful for cleansing the area after bowel movements.     To avoid constipation, take two tablespoons of natural wheat bran, natural oat bran, flax, Benefiber or any over the counter fiber supplement and increase your water intake to 7-8 glasses daily.    Unless you have been prescribed anorectal medication, do not put anything inside your rectum for two weeks: No suppositories, enemas, fingers, etc.  Occasionally, you may have more bleeding than usual after the banding procedure.  This is often from the untreated hemorrhoids rather than the treated one.  Don't be concerned if there is a tablespoon or so of blood.  If there is more blood than this, lie flat with your bottom higher than your head and apply an ice pack to the area. If the bleeding does not stop within a half an hour or if you feel faint, call our office at [phone] or go to the emergency room.  Problems are not common; however, if there is a substantial amount of bleeding, severe pain, chills, fever or difficulty passing urine (very rare) or other problems, you should call us at [phone] or report to the nearest emergency room.  Do not stay seated continuously for more than 2-3 hours for a day or two after the procedure.   Tighten your buttock muscles 10-15 times every two hours and take 10-15 deep breaths every 1-2 hours.  Do not spend more than a few minutes on the toilet if you cannot empty your bowel; instead re-visit the toilet at a later time.      Thank you for entrusting me with your care and for choosing [facility], Dr. Zenovia Jarred

## 2021-06-10 NOTE — Progress Notes (Signed)
Jon King is a 55 year old male with a history of multiple polyps, family history of colon cancer, IBS with loose stools, history of symptomatic internal hemorrhoids and fissure who presents today for hemorrhoidal banding.  Recent colonoscopy for surveillance on 04/07/2021.  Reviewed today  Symptomatic internal hemorrhoids: Intermittent rectal bleeding, prolapse   PROCEDURE NOTE:  The patient presents with symptomatic grade 2 internal hemorrhoids, requesting rubber band ligation of his hemorrhoidal disease.  All risks, benefits and alternative forms of therapy were described and informed consent was obtained.   The anorectum was pre-medicated with 0.125% nitroglycerin ointment The decision was made to band the LL internal hemorrhoid, and the [place] was used to perform band ligation without complication.   Digital anorectal examination was then performed to assure proper positioning of the band, and to adjust the banded tissue as required.  The patient was discharged home without pain or other issues.  Dietary and behavioral recommendations were given and along with follow-up instructions.     The following adjunctive treatments were recommended: Rifaximin 550 mg 3 times daily x14 days have been previously prescribed for IBS-D --he [facility] but he never received it.  We will resend this prescription at this time  The patient will return as scheduled for follow-up and possible additional banding as required. No complications were encountered and the patient tolerated the procedure well.

## 2021-06-11 ENCOUNTER — Telehealth: Payer: Self-pay

## 2021-06-11 NOTE — Telephone Encounter (Signed)
Spoke with [facility] and the prescription for Doreene Nest is a co-pay of over 400 dollars. Please advise.

## 2021-06-11 NOTE — Telephone Encounter (Signed)
Unfortunately, no. Medication was originally $2,000.00. WITH approval from insurance, medication is still 400 dollars. Patient would have similar cost at other pharmacies as well because his insurance must be used for coverage and this is what they have agreed the cost should be.

## 2021-06-11 NOTE — Telephone Encounter (Signed)
Ok, would communicate with patient that this is the cost He stated yesterday symptoms weren't as bad recently We could try another abx regimen, but if not significant symptoms now, would just hold on therapy

## 2021-06-11 NOTE — Telephone Encounter (Signed)
I have left a voicemail for patient to call back. 

## 2021-06-11 NOTE — Telephone Encounter (Signed)
Any other recourse here?  Cheaper from local pharmacy  I would expect better coverage for IBS-D and commercial insurance  JMP

## 2021-06-12 ENCOUNTER — Encounter: Payer: Self-pay | Admitting: *Deleted

## 2021-06-12 NOTE — Telephone Encounter (Signed)
I have spoken to patient's wife, Judeen Hammans (ok to speak to per designated party release). I advised that Xifaxan would be around $400.00 even with insurance approval. Therefore, Dr Hilarie Fredrickson says if patient is truly not having any symptoms right now, he would hold off on medication. However, should he develop symptoms again, he is free to call and we can explore other antibiotic therapies. Sherry verbalizes understanding and indicates she will make patient aware of this information.

## 2021-08-04 ENCOUNTER — Encounter: Payer: Self-pay | Admitting: Internal Medicine

## 2021-08-04 ENCOUNTER — Ambulatory Visit (INDEPENDENT_AMBULATORY_CARE_PROVIDER_SITE_OTHER): Payer: BC Managed Care – PPO | Admitting: Internal Medicine

## 2021-08-04 VITALS — BP 130/80 | HR 101 | Ht 69.0 in | Wt 237.0 lb

## 2021-08-04 DIAGNOSIS — K580 Irritable bowel syndrome with diarrhea: Secondary | ICD-10-CM

## 2021-08-04 DIAGNOSIS — K602 Anal fissure, unspecified: Secondary | ICD-10-CM

## 2021-08-04 DIAGNOSIS — K648 Other hemorrhoids: Secondary | ICD-10-CM

## 2021-08-04 MED ORDER — DIPHENOXYLATE-ATROPINE 2.5-0.025 MG PO TABS: 1.0000 | ORAL_TABLET | Freq: Three times a day (TID) | ORAL | 1 refills | Status: DC | PRN

## 2021-08-04 NOTE — Patient Instructions (Addendum)
We have sent the following medications to your pharmacy for you to pick up at your convenience: Lomotil 1 tablet three times daily as needed for diarrhea  You have been scheduled for your 3rd hemorrhoidal banding on10/27/22 at 10:50 am.  HEMORRHOID BANDING PROCEDURE    FOLLOW-UP CARE   The procedure you have had should have been relatively painless since the banding of the area involved does not have nerve endings and there is no pain sensation.  The rubber band cuts off the blood supply to the hemorrhoid and the band may fall off as soon as 48 hours after the banding (the band may occasionally be seen in the toilet bowl following a bowel movement). You may notice a temporary feeling of fullness in the rectum which should respond adequately to plain Tylenol or Motrin.  Following the banding, avoid strenuous exercise that evening and resume full activity the next day.  A sitz bath (soaking in a warm tub) or bidet is soothing, and can be useful for cleansing the area after bowel movements.     To avoid constipation, take two tablespoons of natural wheat bran, natural oat bran, flax, Benefiber or any over the counter fiber supplement and increase your water intake to 7-8 glasses daily.    Unless you have been prescribed anorectal medication, do not put anything inside your rectum for two weeks: No suppositories, enemas, fingers, etc.  Occasionally, you may have more bleeding than usual after the banding procedure.  This is often from the untreated hemorrhoids rather than the treated one.  Don't be concerned if there is a tablespoon or so of blood.  If there is more blood than this, lie flat with your bottom higher than your head and apply an ice pack to the area. If the bleeding does not stop within a half an hour or if you feel faint, call our office at [phone] or go to the emergency room.  Problems are not common; however, if there is a substantial amount of bleeding, severe pain,  chills, fever or difficulty passing urine (very rare) or other problems, you should call us at [phone] or report to the nearest emergency room.  Do not stay seated continuously for more than 2-3 hours for a day or two after the procedure.  Tighten your buttock muscles 10-15 times every two hours and take 10-15 deep breaths every 1-2 hours.  Do not spend more than a few minutes on the toilet if you cannot empty your bowel; instead re-visit the toilet at a later time.   If you are age 75 or older, your body mass index should be between 23-30. Your Body mass index is 35 kg/m. If this is out of the aforementioned range listed, please consider follow up with your Primary Care Provider.  If you are age 4 or younger, your body mass index should be between 19-25. Your Body mass index is 35 kg/m. If this is out of the aformentioned range listed, please consider follow up with your Primary Care Provider.   ________________________________________________________  The [place] [facility] providers would like to encourage you to use [facility] to communicate with providers for non-urgent requests or questions.  Due to long hold times on the telephone, sending your provider a message by [facility] may be a faster and more efficient way to get a response.  Please allow 48 business hours for a response.  Please remember that this is for non-urgent requests.   Due to recent changes in healthcare  laws, you may see the results of your imaging and laboratory studies on MyChart before your provider has had a chance to review them.  We understand that in some cases there may be results that are confusing or concerning to you. Not all laboratory results come back in the same time frame and the provider may be waiting for multiple results in order to interpret others.  Please give [place] 48 hours in order for your provider to thoroughly review all the results before contacting the office for clarification of your results.

## 2021-08-04 NOTE — Progress Notes (Signed)
Jon King is a 55 year old male with a history of multiple colonic polyps, family history of colon cancer, IBS with loose stools, history of symptomatic internal hemorrhoids and anal fissure who presents for repeat hemorrhoidal banding  Symptomatic internal hemorrhoids: Intermittent rectal bleeding and prolapse  He did have 1 episode of anal fissure exacerbated by diarrhea recently.  He used diltiazem with improvement in perianal pain and resolution of rectal bleeding. --Rifaximin was cost prohibitive for IBS-D  Tolerated the first banding though he has not seen any significant improvement in prolapse symptoms.   PROCEDURE NOTE:  The patient presents with symptomatic grade 2 internal hemorrhoids, requesting rubber band ligation of his her hemorrhoidal disease.  All risks, benefits and alternative forms of therapy were described and informed consent was obtained.   The anorectum was pre-medicated with 0.125% nitroglycerin ointment The decision was made to band the RA (LL banded at visit #1) internal hemorrhoid, and the [place] was used to perform band ligation without complication.   Digital anorectal examination was then performed to assure proper positioning of the band, and to adjust the banded tissue as required.  The patient was discharged home without pain or other issues.  Dietary and behavioral recommendations were given and along with follow-up instructions.     The following adjunctive treatments were recommended: -- Intermittent anal fissure, continue diltiazem gel 2-3 times daily --Lomotil 1 tablet 3 times daily as needed for diarrhea --If not effective we can consider rifaximin by samples if available  The patient will return as scheduled for follow-up and possible additional banding as required. No complications were encountered and the patient tolerated the procedure well.

## 2021-08-21 ENCOUNTER — Encounter: Payer: Self-pay | Admitting: Internal Medicine

## 2021-08-21 ENCOUNTER — Ambulatory Visit (INDEPENDENT_AMBULATORY_CARE_PROVIDER_SITE_OTHER): Payer: BC Managed Care – PPO | Admitting: Internal Medicine

## 2021-08-21 VITALS — BP 126/90 | HR 97 | Ht 69.0 in | Wt 236.5 lb

## 2021-08-21 DIAGNOSIS — K648 Other hemorrhoids: Secondary | ICD-10-CM

## 2021-08-21 NOTE — Progress Notes (Signed)
Marcial Pless is a 55 year old male with a history of multiple colonic polyps, family history of colon cancer, IBS with loose stools, symptomatic internal hemorrhoids and anal fissure who is here for follow-up.  Banding visit #3 Symptomatic internal hemorrhoids: Intermittent rectal bleeding and prolapse  He feels improvement after his last banding visit and all symptoms Has not needed loperamide or Lomotil   PROCEDURE NOTE:  The patient presents with symptomatic grade 2 internal hemorrhoids, requesting rubber band ligation of his hemorrhoidal disease.  All risks, benefits and alternative forms of therapy were described and informed consent was obtained.   The anorectum was pre-medicated with 0.125% nitroglycerin ointment The decision was made to band the RA (LL and RP banded previously) internal hemorrhoid, and the [place] was used to perform band ligation without complication.   Digital anorectal examination was then performed to assure proper positioning of the band, and to adjust the banded tissue as required.  The patient was discharged home without pain or other issues.  Dietary and behavioral recommendations were given and along with follow-up instructions.     The following adjunctive treatments were recommended: Can use diltiazem intermittently as needed for fissure Lomotil 1 tablet 3 times daily as needed  The patient will return as needed for follow-up and possible additional banding as required. No complications were encountered and the patient tolerated the procedure well.

## 2021-08-21 NOTE — Patient Instructions (Signed)
HEMORRHOID BANDING PROCEDURE    FOLLOW-UP CARE   The procedure you have had should have been relatively painless since the banding of the area involved does not have nerve endings and there is no pain sensation.  The rubber band cuts off the blood supply to the hemorrhoid and the band may fall off as soon as 48 hours after the banding (the band may occasionally be seen in the toilet bowl following a bowel movement). You may notice a temporary feeling of fullness in the rectum which should respond adequately to plain Tylenol or Motrin.  Following the banding, avoid strenuous exercise that evening and resume full activity the next day.  A sitz bath (soaking in a warm tub) or bidet is soothing, and can be useful for cleansing the area after bowel movements.     To avoid constipation, take two tablespoons of natural wheat bran, natural oat bran, flax, Benefiber or any over the counter fiber supplement and increase your water intake to 7-8 glasses daily.    Unless you have been prescribed anorectal medication, do not put anything inside your rectum for two weeks: No suppositories, enemas, fingers, etc.  Occasionally, you may have more bleeding than usual after the banding procedure.  This is often from the untreated hemorrhoids rather than the treated one.  Don't be concerned if there is a tablespoon or so of blood.  If there is more blood than this, lie flat with your bottom higher than your head and apply an ice pack to the area. If the bleeding does not stop within a half an hour or if you feel faint, call our office at [phone] or go to the emergency room.  Problems are not common; however, if there is a substantial amount of bleeding, severe pain, chills, fever or difficulty passing urine (very rare) or other problems, you should call us at [phone] or report to the nearest emergency room.  Do not stay seated continuously for more than 2-3 hours for a day or two after the procedure.   Tighten your buttock muscles 10-15 times every two hours and take 10-15 deep breaths every 1-2 hours.  Do not spend more than a few minutes on the toilet if you cannot empty your bowel; instead re-visit the toilet at a later time.   _____________________________________________________  If you are age 55 or older, your body mass index should be between 23-30. Your Body mass index is 34.92 kg/m. If this is out of the aforementioned range listed, please consider follow up with your Primary Care Provider.  If you are age 55 or younger, your body mass index should be between 19-25. Your Body mass index is 34.92 kg/m. If this is out of the aformentioned range listed, please consider follow up with your Primary Care Provider.   ________________________________________________________  The [place] [facility] providers would like to encourage you to use [facility] to communicate with providers for non-urgent requests or questions.  Due to long hold times on the telephone, sending your provider a message by [facility] may be a faster and more efficient way to get a response.  Please allow 48 business hours for a response.  Please remember that this is for non-urgent requests.  _______________________________________________________ Due to recent changes in healthcare laws, you may see the results of your imaging and laboratory studies on MyChart before your provider has had a chance to review them.  We understand that in some cases there may be results that are confusing or concerning  to you. Not all laboratory results come back in the same time frame and the provider may be waiting for multiple results in order to interpret others.  Please give [place] 48 hours in order for your provider to thoroughly review all the results before contacting the office for clarification of your results.

## 2021-10-06 ENCOUNTER — Ambulatory Visit (INDEPENDENT_AMBULATORY_CARE_PROVIDER_SITE_OTHER): Payer: BC Managed Care – PPO | Admitting: Internal Medicine

## 2021-10-06 ENCOUNTER — Other Ambulatory Visit: Payer: Self-pay

## 2021-10-06 ENCOUNTER — Encounter: Payer: Self-pay | Admitting: Internal Medicine

## 2021-10-06 VITALS — BP 122/78 | HR 93 | Temp 98.0°F | Ht 71.0 in | Wt 246.0 lb

## 2021-10-06 DIAGNOSIS — Z23 Encounter for immunization: Secondary | ICD-10-CM

## 2021-10-06 DIAGNOSIS — Z0000 Encounter for general adult medical examination without abnormal findings: Secondary | ICD-10-CM | POA: Diagnosis not present

## 2021-10-06 NOTE — Assessment & Plan Note (Signed)
We discussed age appropriate health related issues, including available/recomended screening tests and vaccinations. We discussed a need for adhering to healthy diet and exercise. Labs were ordered to be later reviewed . All questions were answered. [facility]ing[facility] advised 2019 Coronary calcium score of 0. Wt loss Colon due in 2027, last one in Jan 2022

## 2021-10-06 NOTE — Progress Notes (Signed)
Subjective:  Patient ID: Collier Flowers, male    DOB: [birth year]  Age: 55 y.o. MRN: [id number]  CC: Annual Exam    HPI Layton A Mccarrell presents for a well exam  Outpatient Medications Prior to Visit  Medication Sig Dispense Refill   diltiazem 2 % GEL Diltiazem 2% gel Apply a pea sized amount internally two times daily as needed Dispense 30 GM zero refill 30 g 0   diphenoxylate-atropine (LOMOTIL) 2.5-0.025 MG tablet Take 1 tablet by mouth 3 (three) times daily as needed for diarrhea or loose stools. 60 tablet 1   gabapentin (NEURONTIN) 300 MG capsule Take 1 capsule (300 mg total) by mouth 3 (three) times daily. (Patient taking differently: Take 300 mg by mouth at bedtime.) 270 capsule 3   lisinopril-hydrochlorothiazide (ZESTORETIC) 10-12.5 MG tablet Take 1 tablet by mouth daily. 90 tablet 3   Multiple Vitamin (MULTIVITAMIN) tablet Take 1 tablet by mouth daily.     No facility-administered medications prior to visit.    ROS: Review of Systems  Constitutional:  Negative for appetite change, fatigue and unexpected weight change.  HENT:  Negative for congestion, nosebleeds, sneezing, sore throat and trouble swallowing.   Eyes:  Negative for itching and visual disturbance.  Respiratory:  Negative for cough.   Cardiovascular:  Negative for chest pain, palpitations and leg swelling.  Gastrointestinal:  Negative for abdominal distention, blood in stool, diarrhea and nausea.  Genitourinary:  Negative for frequency and hematuria.  Musculoskeletal:  Negative for back pain, gait problem, joint swelling and neck pain.  Skin:  Negative for rash.  Neurological:  Negative for dizziness, tremors, speech difficulty and weakness.  Psychiatric/Behavioral:  Negative for agitation, dysphoric mood and sleep disturbance. The patient is not nervous/anxious.    Objective:  BP 122/78 (BP Location: Left Arm)   Pulse 93   Temp 98 F (36.7 C) (Oral)   Ht 5\' 11"  (1.803 m)   Wt 246 lb (111.6 kg)   SpO2 95%    BMI 34.31 kg/m   BP Readings from Last 3 Encounters:  10/06/21 122/78  08/21/21 126/90  08/04/21 130/80    Wt Readings from Last 3 Encounters:  10/06/21 246 lb (111.6 kg)  08/21/21 236 lb 8 oz (107.3 kg)  08/04/21 237 lb (107.5 kg)    Physical Exam Constitutional:      General: He is not in acute distress.    Appearance: He is well-developed. He is obese.     Comments: NAD  Eyes:     Conjunctiva/sclera: Conjunctivae normal.     Pupils: Pupils are equal, round, and reactive to light.  Neck:     Thyroid: No thyromegaly.     Vascular: No JVD.  Cardiovascular:     Rate and Rhythm: Normal rate and regular rhythm.     Heart sounds: Normal heart sounds. No murmur heard.   No friction rub. No gallop.  Pulmonary:     Effort: Pulmonary effort is normal. No respiratory distress.     Breath sounds: Normal breath sounds. No wheezing or rales.  Chest:     Chest wall: No tenderness.  Abdominal:     General: Bowel sounds are normal. There is no distension.     Palpations: Abdomen is soft. There is no mass.     Tenderness: There is no abdominal tenderness. There is no guarding or rebound.  Musculoskeletal:        General: No tenderness. Normal range of motion.     Cervical back: Normal  range of motion.  Lymphadenopathy:     Cervical: No cervical adenopathy.  Skin:    General: Skin is warm and dry.     Findings: No rash.  Neurological:     Mental Status: He is alert and oriented to person, place, and time.     Cranial Nerves: No cranial nerve deficit.     Motor: No abnormal muscle tone.     Coordination: Coordination normal.     Gait: Gait normal.     Deep Tendon Reflexes: Reflexes are normal and symmetric.  Psychiatric:        Behavior: Behavior normal.        Thought Content: Thought content normal.        Judgment: Judgment normal.  Recent rectal - per GI  Lab Results  Component Value Date   WBC 10.1 10/06/2021   HGB 14.7 10/06/2021   HCT 43.6 10/06/2021   PLT  213.0 10/06/2021   GLUCOSE 83 10/06/2021   CHOL 182 10/06/2021   TRIG 379.0 (H) 10/06/2021   HDL 36.40 (L) 10/06/2021   LDLDIRECT 103.0 10/06/2021   ALT 28 10/06/2021   AST 18 10/06/2021   NA 141 10/06/2021   K 3.8 10/06/2021   CL 104 10/06/2021   CREATININE 0.91 10/06/2021   BUN 17 10/06/2021   CO2 27 10/06/2021   TSH 2.45 10/06/2021   PSA 0.41 10/06/2021    CT CARDIAC SCORING  Addendum Date: 10/24/2018   ADDENDUM REPORT: 10/24/2018 13:57 CLINICAL DATA:  Risk stratification EXAM: Coronary Calcium Score TECHNIQUE: The patient was scanned on a Siemens Somatom 64 slice scanner. Axial non-contrast 3 mm slices were carried out through the heart. The data set was analyzed on a dedicated work station and scored using the [place]. FINDINGS: Non-cardiac: See separate report from [facility]. Ascending aorta: Normal diameter 3.4 Pericardium: Normal Coronary arteries: No calcium noted IMPRESSION: Coronary calcium score of 0. [name] Electronically Signed   By: [name] M.D.   On: 10/24/2018 13:57   Result Date: 10/24/2018 EXAM: OVER-READ INTERPRETATION  CT CHEST The following report is an over-read performed by radiologist Dr. Markus Daft of [facility], [place] on 10/24/2018. This over-read does not include interpretation of cardiac or coronary anatomy or pathology. The coronary calcium score/coronary CTA interpretation by the cardiologist is attached. COMPARISON:  None. FINDINGS: Vascular: Normal caliber of the visualized thoracic aorta without atherosclerotic calcifications. Heart size is normal with minimal pericardial fluid. Normal caliber of the main pulmonary arteries. Mediastinum/Nodes: Visualized mediastinal and hilar tissue is unremarkable. Lungs/Pleura: No pleural effusions. 4 mm peripheral nodular density in the right middle lobe on sequence 5 image 24. There is concern for mild centrilobular emphysema. Mild scarring or atelectasis along the medial right middle  lobe. No large areas of airspace disease or consolidation in the visualized lungs. Focal thickening along the right minor fissure on sequence 5, image 13. Upper Abdomen: Images of the upper abdomen are unremarkable. Musculoskeletal: Visualized bone structures are unremarkable. IMPRESSION: 1. No acute findings. 2. Concern for mild centrilobular emphysema. 3. Indeterminate 4 mm nodule in the right middle lobe. No follow-up needed if patient is low-risk. Non-contrast chest CT can be considered in 12 months if patient is high-risk. This recommendation follows the consensus statement: Guidelines for Management of Incidental Pulmonary Nodules Detected on CT Images: From the Fleischner Society 2017; Radiology 2017; 284:228-243. Electronically Signed: By: Markus Daft M.D. On: 10/24/2018 08:59    Assessment & Plan:   Problem List Items Addressed  This Visit     Well adult exam    We discussed age appropriate health related issues, including available/recomended screening tests and vaccinations. We discussed a need for adhering to healthy diet and exercise. Labs were ordered to be later reviewed . All questions were answered. Shingrix advised 2019 Coronary calcium score of 0. Wt loss Colon due in 2027, last one in Jan 2022      Relevant Orders   TSH (Completed)   Urinalysis (Completed)   CBC with Differential/Platelet (Completed)   Lipid panel (Completed)   Comprehensive metabolic panel (Completed)   PSA (Completed)   Other Visit Diagnoses     Need for diphtheria-tetanus-pertussis (Tdap) vaccine    -  Primary         No orders of the defined types were placed in this encounter.      Follow-up: Return in about 1 year (around 10/06/2022) for Wellness Exam.  Walker Kehr, MD

## 2021-10-07 LAB — URINALYSIS
Bilirubin Urine: NEGATIVE
Hgb urine dipstick: NEGATIVE
Ketones, ur: NEGATIVE
Leukocytes,Ua: NEGATIVE
Nitrite: NEGATIVE
Specific Gravity, Urine: >=1.03 — AB (ref 1.000–1.030)
Total Protein, Urine: NEGATIVE
Urine Glucose: NEGATIVE
Urobilinogen, UA: 0.2 (ref 0.0–1.0)
pH: 5.5 (ref 5.0–8.0)

## 2021-10-07 LAB — CBC WITH DIFFERENTIAL/PLATELET
Basophils Absolute: 0.1 10*3/uL (ref 0.0–0.1)
Basophils Relative: 1.5 % (ref 0.0–3.0)
Eosinophils Absolute: 0.2 10*3/uL (ref 0.0–0.7)
Eosinophils Relative: 2.4 % (ref 0.0–5.0)
HCT: 43.6 % (ref 39.0–52.0)
Hemoglobin: 14.7 g/dL (ref 13.0–17.0)
Lymphocytes Relative: 23.7 % (ref 12.0–46.0)
Lymphs Abs: 2.4 10*3/uL (ref 0.7–4.0)
MCHC: 33.7 g/dL (ref 30.0–36.0)
MCV: 84.4 fl (ref 78.0–100.0)
Monocytes Absolute: 1 10*3/uL (ref 0.1–1.0)
Monocytes Relative: 9.8 % (ref 3.0–12.0)
Neutro Abs: 6.3 10*3/uL (ref 1.4–7.7)
Neutrophils Relative %: 62.6 % (ref 43.0–77.0)
Platelets: 213 10*3/uL (ref 150.0–400.0)
RBC: 5.16 Mil/uL (ref 4.22–5.81)
RDW: 14 % (ref 11.5–15.5)
WBC: 10.1 10*3/uL (ref 4.0–10.5)

## 2021-10-07 LAB — COMPREHENSIVE METABOLIC PANEL
ALT: 28 U/L (ref 0–53)
AST: 18 U/L (ref 0–37)
Albumin: 4.3 g/dL (ref 3.5–5.2)
Alkaline Phosphatase: 59 U/L (ref 39–117)
BUN: 17 mg/dL (ref 6–23)
CO2: 27 mEq/L (ref 19–32)
Calcium: 9.1 mg/dL (ref 8.4–10.5)
Chloride: 104 mEq/L (ref 96–112)
Creatinine, Ser: 0.91 mg/dL (ref 0.40–1.50)
GFR: 94.95 mL/min (ref >60.00)
Glucose, Bld: 83 mg/dL (ref 70–99)
Potassium: 3.8 mEq/L (ref 3.5–5.1)
Sodium: 141 mEq/L (ref 135–145)
Total Bilirubin: 0.7 mg/dL (ref 0.2–1.2)
Total Protein: 6.7 g/dL (ref 6.0–8.3)

## 2021-10-07 LAB — PSA: PSA: 0.41 ng/mL (ref 0.10–4.00)

## 2021-10-07 LAB — LIPID PANEL
Cholesterol: 182 mg/dL (ref 0–200)
HDL: 36.4 mg/dL — ABNORMAL LOW (ref >39.00)
NonHDL: 145.19
Total CHOL/HDL Ratio: 5
Triglycerides: 379 mg/dL — ABNORMAL HIGH (ref 0.0–149.0)
VLDL: 75.8 mg/dL — ABNORMAL HIGH (ref 0.0–40.0)

## 2021-10-07 LAB — TSH: TSH: 2.45 u[IU]/mL (ref 0.35–5.50)

## 2021-10-07 LAB — LDL CHOLESTEROL, DIRECT: Direct LDL: 103 mg/dL

## 2021-10-08 ENCOUNTER — Other Ambulatory Visit: Payer: Self-pay | Admitting: Internal Medicine

## 2021-10-08 MED ORDER — FISH OIL 600 MG PO CAPS: ORAL_CAPSULE | ORAL | 11 refills | Status: AC

## 2021-10-22 DIAGNOSIS — Z1283 Encounter for screening for malignant neoplasm of skin: Secondary | ICD-10-CM | POA: Diagnosis not present

## 2021-10-22 DIAGNOSIS — Z08 Encounter for follow-up examination after completed treatment for malignant neoplasm: Secondary | ICD-10-CM | POA: Diagnosis not present

## 2021-10-22 DIAGNOSIS — D0359 Melanoma in situ of other part of trunk: Secondary | ICD-10-CM | POA: Diagnosis not present

## 2021-10-22 DIAGNOSIS — D485 Neoplasm of uncertain behavior of skin: Secondary | ICD-10-CM | POA: Diagnosis not present

## 2021-10-22 DIAGNOSIS — L988 Other specified disorders of the skin and subcutaneous tissue: Secondary | ICD-10-CM | POA: Diagnosis not present

## 2021-10-22 DIAGNOSIS — Z85820 Personal history of malignant melanoma of skin: Secondary | ICD-10-CM | POA: Diagnosis not present

## 2021-10-22 DIAGNOSIS — D225 Melanocytic nevi of trunk: Secondary | ICD-10-CM | POA: Diagnosis not present

## 2021-11-01 ENCOUNTER — Other Ambulatory Visit: Payer: Self-pay | Admitting: Internal Medicine

## 2021-11-19 DIAGNOSIS — D0359 Melanoma in situ of other part of trunk: Secondary | ICD-10-CM | POA: Diagnosis not present

## 2021-11-19 DIAGNOSIS — L988 Other specified disorders of the skin and subcutaneous tissue: Secondary | ICD-10-CM | POA: Diagnosis not present

## 2022-01-14 IMAGING — DX DG CERVICAL SPINE COMPLETE 4+V
5 series · 5 of 5 positions shown · non-contrast
Comparison: None.

CLINICAL DATA: Bilateral shoulder pain for 2 months, headaches

EXAM:
CERVICAL SPINE - COMPLETE 4+ VIEW

[c-spine lat]
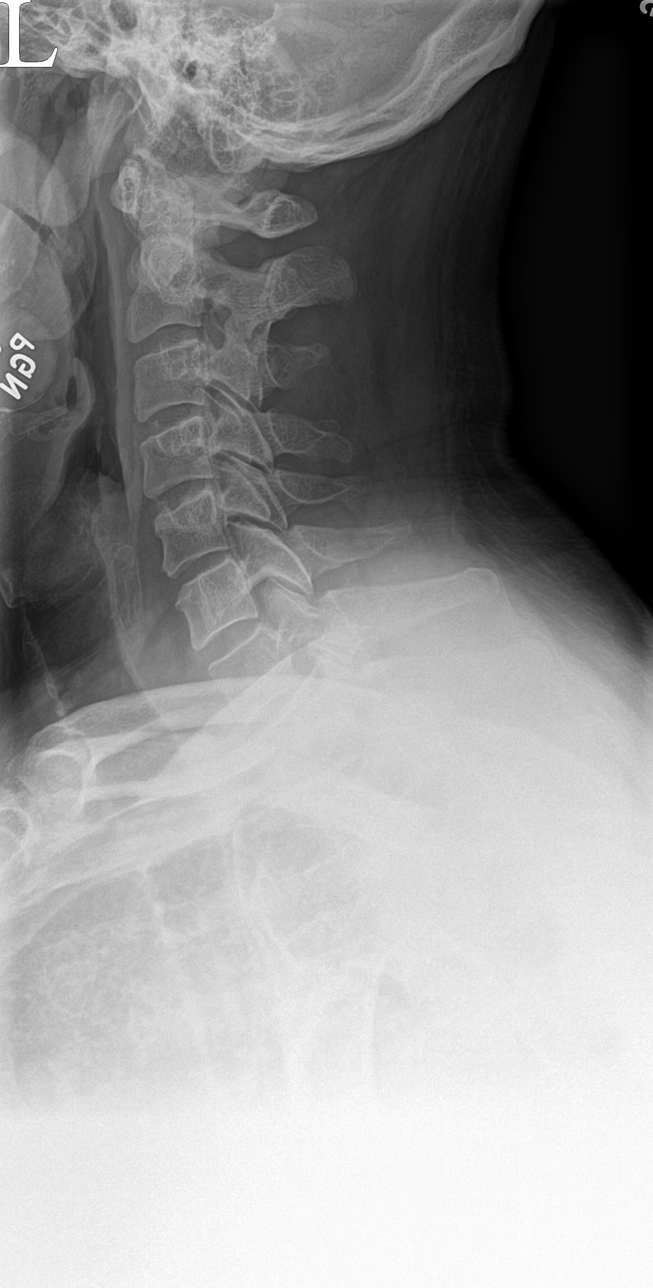

[c-spine obl (1 of 2)]
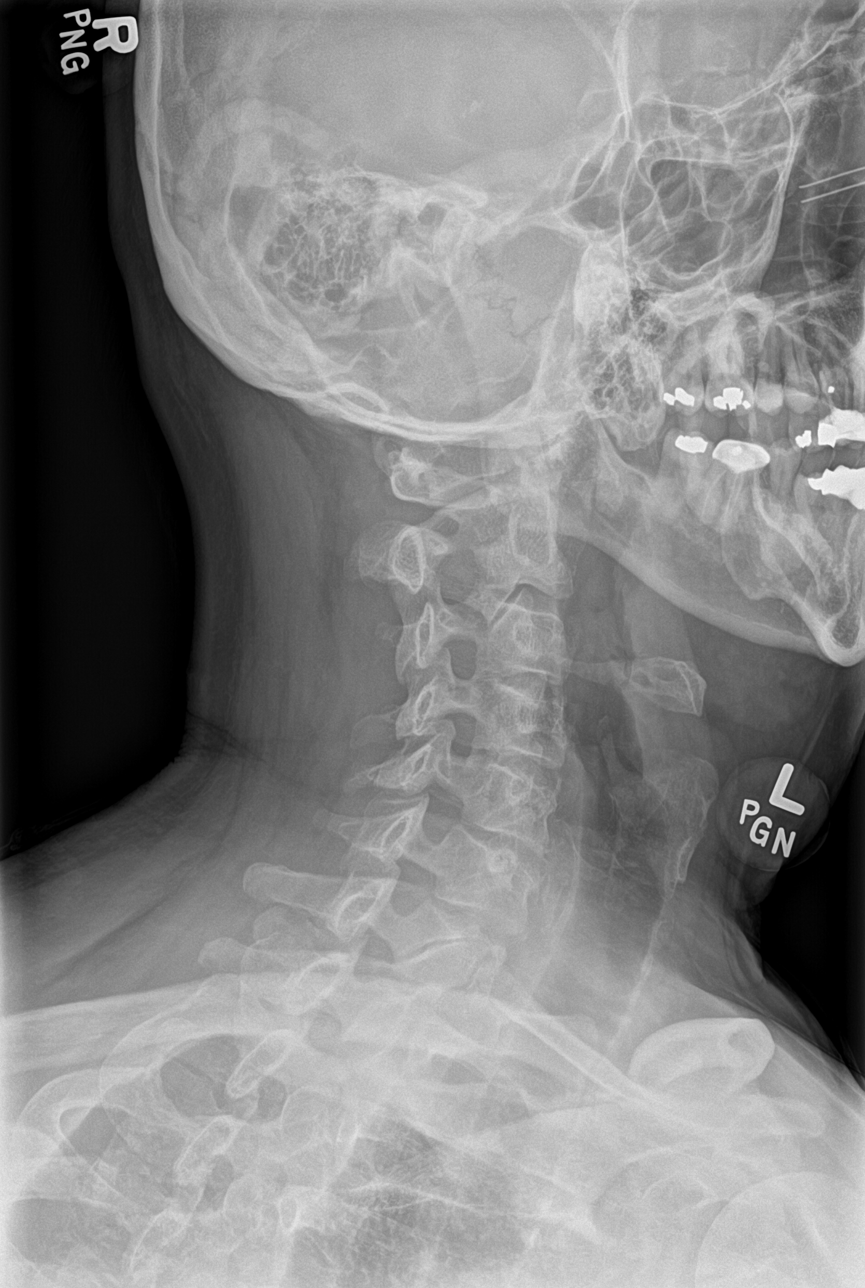

[c-spine obl (2 of 2)]
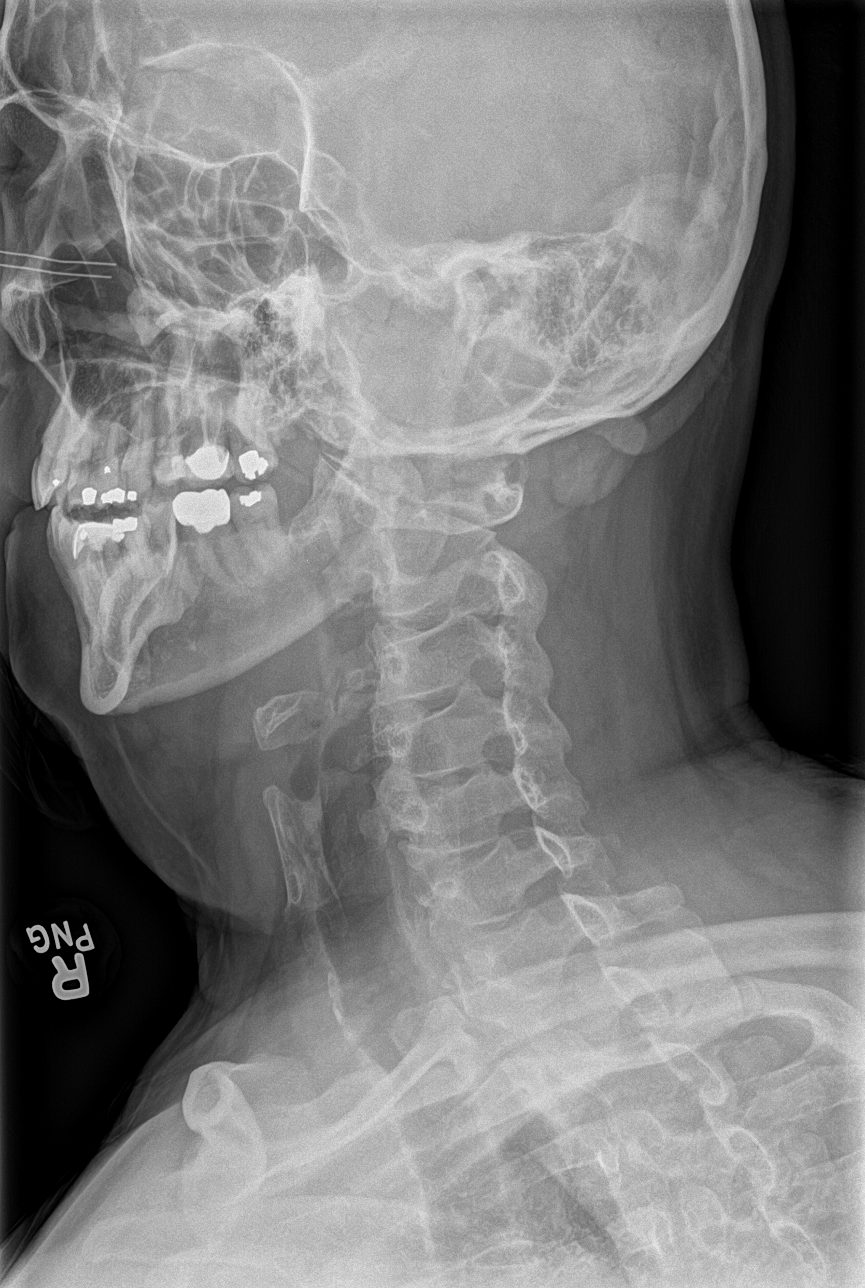

[c-spine ap]
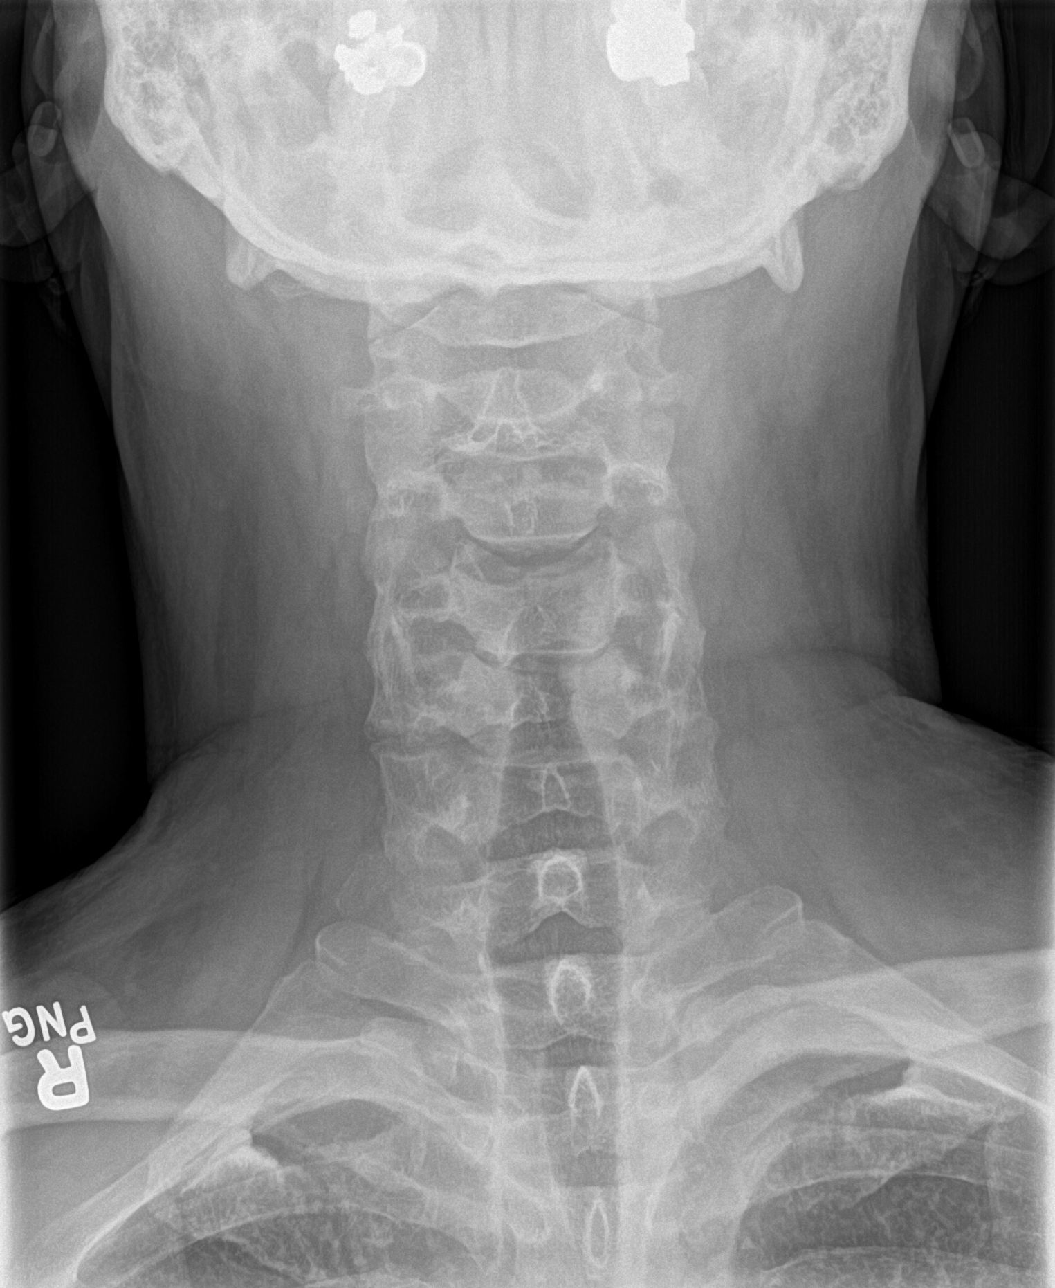

[c-spine open mouth]
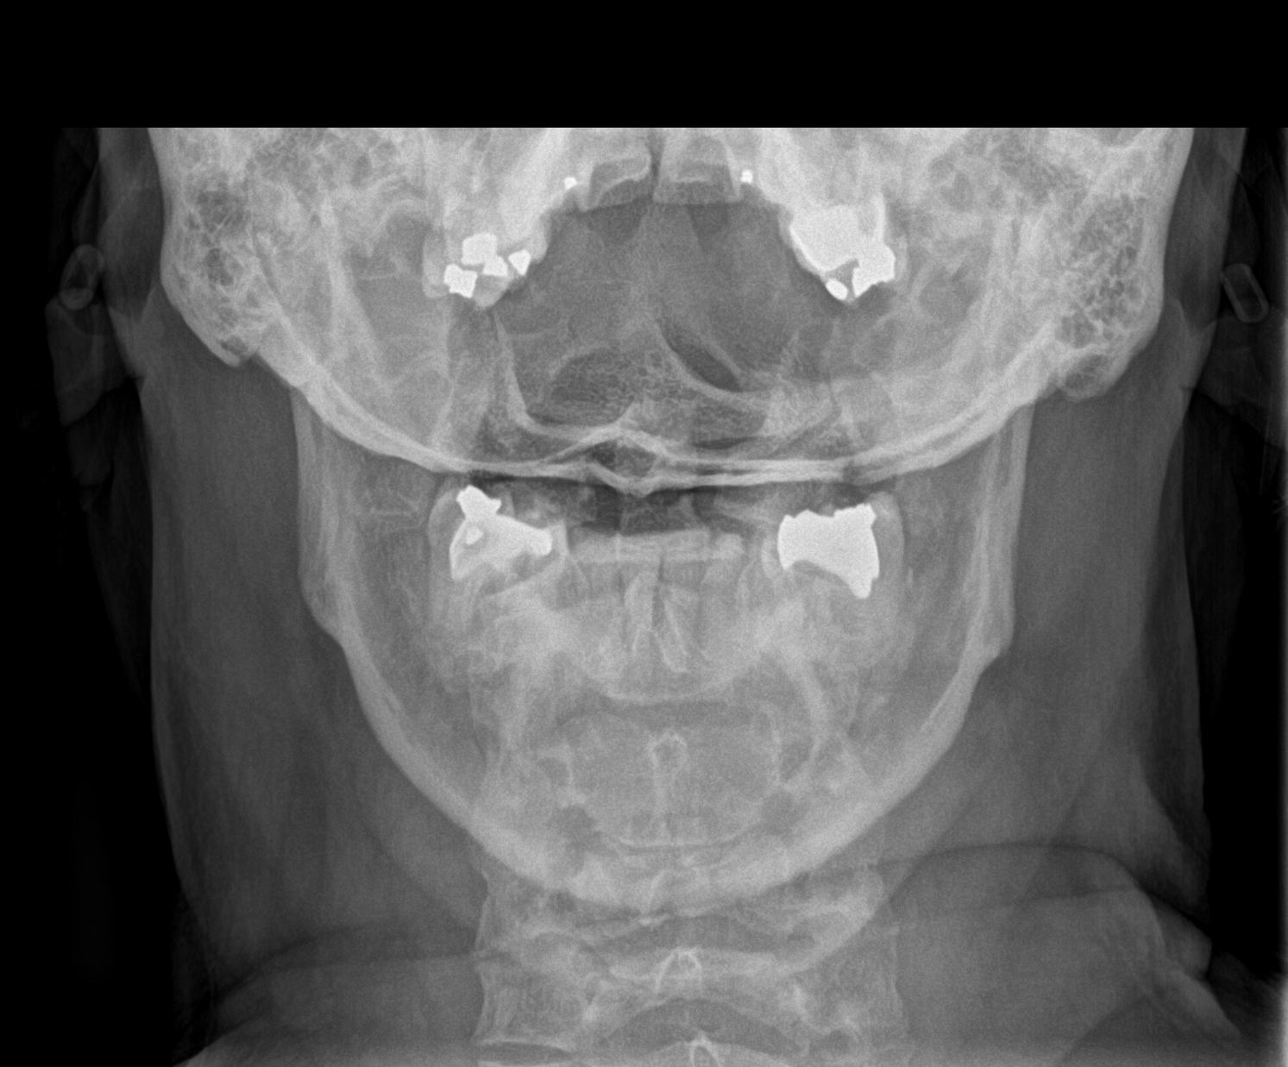

[5 of 5 positions shown; findings below may reference images not displayed]

FINDINGS: Frontal, bilateral oblique, lateral views of the cervical spine are
obtained. Alignment is anatomic to the cervicothoracic junction.
Mild spondylosis at C5/C6. Neural foramina are widely patent.
Prevertebral soft tissues are normal. Lung apices are clear.
IMPRESSION: 1. Minimal lower cervical spondylosis.  No acute bony abnormality.

## 2022-02-18 DIAGNOSIS — D2271 Melanocytic nevi of right lower limb, including hip: Secondary | ICD-10-CM | POA: Diagnosis not present

## 2022-02-18 DIAGNOSIS — Z85820 Personal history of malignant melanoma of skin: Secondary | ICD-10-CM | POA: Diagnosis not present

## 2022-02-18 DIAGNOSIS — D225 Melanocytic nevi of trunk: Secondary | ICD-10-CM | POA: Diagnosis not present

## 2022-02-18 DIAGNOSIS — D485 Neoplasm of uncertain behavior of skin: Secondary | ICD-10-CM | POA: Diagnosis not present

## 2022-02-18 DIAGNOSIS — Z08 Encounter for follow-up examination after completed treatment for malignant neoplasm: Secondary | ICD-10-CM | POA: Diagnosis not present

## 2022-02-18 DIAGNOSIS — Z1283 Encounter for screening for malignant neoplasm of skin: Secondary | ICD-10-CM | POA: Diagnosis not present

## 2022-02-18 DIAGNOSIS — L905 Scar conditions and fibrosis of skin: Secondary | ICD-10-CM | POA: Diagnosis not present

## 2022-03-23 DIAGNOSIS — L259 Unspecified contact dermatitis, unspecified cause: Secondary | ICD-10-CM | POA: Diagnosis not present

## 2022-05-27 DIAGNOSIS — L989 Disorder of the skin and subcutaneous tissue, unspecified: Secondary | ICD-10-CM | POA: Diagnosis not present

## 2022-05-27 DIAGNOSIS — Z1283 Encounter for screening for malignant neoplasm of skin: Secondary | ICD-10-CM | POA: Diagnosis not present

## 2022-05-27 DIAGNOSIS — D2272 Melanocytic nevi of left lower limb, including hip: Secondary | ICD-10-CM | POA: Diagnosis not present

## 2022-05-27 DIAGNOSIS — Z85820 Personal history of malignant melanoma of skin: Secondary | ICD-10-CM | POA: Diagnosis not present

## 2022-05-27 DIAGNOSIS — Z08 Encounter for follow-up examination after completed treatment for malignant neoplasm: Secondary | ICD-10-CM | POA: Diagnosis not present

## 2022-05-27 DIAGNOSIS — D225 Melanocytic nevi of trunk: Secondary | ICD-10-CM | POA: Diagnosis not present

## 2022-05-27 DIAGNOSIS — D2271 Melanocytic nevi of right lower limb, including hip: Secondary | ICD-10-CM | POA: Diagnosis not present

## 2022-05-27 DIAGNOSIS — D485 Neoplasm of uncertain behavior of skin: Secondary | ICD-10-CM | POA: Diagnosis not present

## 2022-06-17 DIAGNOSIS — D485 Neoplasm of uncertain behavior of skin: Secondary | ICD-10-CM | POA: Diagnosis not present

## 2022-06-17 DIAGNOSIS — L988 Other specified disorders of the skin and subcutaneous tissue: Secondary | ICD-10-CM | POA: Diagnosis not present

## 2022-06-17 DIAGNOSIS — L98499 Non-pressure chronic ulcer of skin of other sites with unspecified severity: Secondary | ICD-10-CM | POA: Diagnosis not present

## 2022-09-22 DIAGNOSIS — Z08 Encounter for follow-up examination after completed treatment for malignant neoplasm: Secondary | ICD-10-CM | POA: Diagnosis not present

## 2022-09-22 DIAGNOSIS — D225 Melanocytic nevi of trunk: Secondary | ICD-10-CM | POA: Diagnosis not present

## 2022-09-22 DIAGNOSIS — Z1283 Encounter for screening for malignant neoplasm of skin: Secondary | ICD-10-CM | POA: Diagnosis not present

## 2022-09-22 DIAGNOSIS — B078 Other viral warts: Secondary | ICD-10-CM | POA: Diagnosis not present

## 2022-09-22 DIAGNOSIS — Z86006 Personal history of melanoma in-situ: Secondary | ICD-10-CM | POA: Diagnosis not present

## 2022-10-08 ENCOUNTER — Telehealth (INDEPENDENT_AMBULATORY_CARE_PROVIDER_SITE_OTHER): Payer: BC Managed Care – PPO | Admitting: Emergency Medicine

## 2022-10-08 ENCOUNTER — Encounter: Payer: Self-pay | Admitting: Emergency Medicine

## 2022-10-08 DIAGNOSIS — R051 Acute cough: Secondary | ICD-10-CM

## 2022-10-08 DIAGNOSIS — U071 COVID-19: Secondary | ICD-10-CM | POA: Diagnosis not present

## 2022-10-08 MED ORDER — NIRMATRELVIR/RITONAVIR (PAXLOVID)TABLET: 3.0000 | ORAL_TABLET | Freq: Two times a day (BID) | ORAL | 0 refills | Status: AC

## 2022-10-08 MED ORDER — HYDROCODONE BIT-HOMATROP MBR 5-1.5 MG/5ML PO SOLN: 5.0000 mL | Freq: Every evening | ORAL | 0 refills | Status: DC | PRN

## 2022-10-08 NOTE — Progress Notes (Signed)
Telemedicine Encounter- SOAP NOTE Established Patient MyChart video conference Patient: Home  Provider: Office   Patient present only  This video encounter was conducted with the patient's (or proxy's) verbal consent via audio telecommunications: yes/no: Yes Patient was instructed to have this encounter in a suitably private space; and to only have persons present to whom they give permission to participate. In addition, patient identity was confirmed by use of name plus two identifiers (DOB and address).  I discussed the limitations, risks, security and privacy concerns of performing an evaluation and management service by telephone and the availability of in person appointments. I also discussed with the patient that there may be a patient responsible charge related to this service. The patient expressed understanding and agreed to proceed.  I spent a total of TIME; 0 MIN TO 60 MIN: 20 minutes talking with the patient or their proxy.  Chief complaint: Flulike symptoms  Subjective   Jon King is a 56 y.o. male established patient. Telephone visit today complaining of cough and congestion that started yesterday morning.  Tested positive for COVID. Denies difficulty breathing or chest pain.  Able to eat and drink.  Denies nausea or vomiting.  Denies abdominal pain or diarrhea.  No other associated symptoms. No other complaints or medical concerns today.  HPI   Patient Active Problem List   Diagnosis Date Noted   Vertigo 10/02/2020   Genetic testing 03/18/2020   Family history of colonic polyps    Family history of breast cancer    Family history of lung cancer    Family history of colon cancer    Family history of gene mutation    Obese 06/29/2019   Hypertriglyceridemia 12/28/2018   Snoring 12/28/2018   Well adult exam 09/27/2018   Tachycardia 09/27/2018   Colon polyps 09/27/2018   Keloid scar 09/27/2018   Neck pain 07/12/2013   History of kidney stones     Headache(784.0)     Past Medical History:  Diagnosis Date   Allergy    Degenerative disc disease, cervical    Diverticulosis    Family history of breast cancer    Family history of colon cancer    Family history of colonic polyps    Family history of gene mutation    Family history of lung cancer    Headache(784.0)    orginate from nerve thing in head   History of kidney stones    last stone nov 2015   Hx of adenomatous colonic polyps    Hypertension    Internal hemorrhoids    Internal hemorrhoids    Skin rash    right should road rash healing no drainage   Sleep apnea    pending home test    Current Outpatient Medications  Medication Sig Dispense Refill   diltiazem 2 % GEL Diltiazem 2% gel Apply a pea sized amount internally two times daily as needed Dispense 30 GM zero refill 30 g 0   diphenoxylate-atropine (LOMOTIL) 2.5-0.025 MG tablet Take 1 tablet by mouth 3 (three) times daily as needed for diarrhea or loose stools. 60 tablet 1   gabapentin (NEURONTIN) 300 MG capsule Take 1 capsule (300 mg total) by mouth at bedtime. 90 capsule 5   lisinopril-hydrochlorothiazide (ZESTORETIC) 10-12.5 MG tablet Take 1 tablet by mouth once daily 90 tablet 3   Multiple Vitamin (MULTIVITAMIN) tablet Take 1 tablet by mouth daily.     Omega-3 Fatty Acids (FISH OIL) 600 MG CAPS 1capsule bid 60 capsule  11   No current facility-administered medications for this visit.    No Known Allergies  Social History   Socioeconomic History   Marital status: Married    Spouse name: Judeen Hammans   Number of children: 2   Years of education: 12   Highest education level: Not on file  Occupational History    Employer: [employer]    Comment: [employer].  Tobacco Use   Smoking status: Former    Packs/day: 1.00    Years: 20.00    Total pack years: 20.00    Types: Cigarettes    Quit date: 09/11/2001    Years since quitting: 21.0   Smokeless tobacco: Never  Vaping Use   Vaping Use: Never used   Substance and Sexual Activity   Alcohol use: Yes    Comment: rare   Drug use: No   Sexual activity: Yes  Other Topics Concern   Not on file  Social History Narrative   Patient lives at home with his wife Judeen Hammans). Patient works full time. 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 Patient has high school education.   Right handed.   Caffeine- sodas - two daily.   Social Determinants of Health   Financial Resource Strain: Not on file  Food Insecurity: Not on file  Transportation Needs: Not on file  Physical Activity: Not on file  Stress: Not on file  Social Connections: Not on file  Intimate Partner Violence: Not on file    Review of Systems  Constitutional: Negative.   HENT:  Positive for congestion. Negative for sore throat.   Respiratory:  Positive for cough. Negative for shortness of breath.   Cardiovascular: Negative.  Negative for chest pain and palpitations.  Gastrointestinal:  Negative for abdominal pain, diarrhea, nausea and vomiting.  Genitourinary: Negative.  Negative for dysuria and hematuria.  Skin: Negative.  Negative for rash.  Neurological: Negative.  Negative for dizziness and headaches.  All other systems reviewed and are negative.   Objective  Alert and oriented x 3 in no apparent respiratory distress Vitals as reported by the patient: There were no vitals filed for this visit.  Problem List Items Addressed This Visit   None Visit Diagnoses     COVID-19 virus infection    -  Primary   Relevant Medications   nirmatrelvir/ritonavir EUA (PAXLOVID) 20 x 150 MG & 10 x '100MG'$  TABS   Acute cough       Relevant Medications   HYDROcodone bit-homatropine (HYCODAN) 5-1.5 MG/5ML syrup     Clinically stable.  No complications.  No red flag signs or symptoms. ED precautions given.  Will benefit from starting Paxlovid for the next 5 days. Cough management discussed.  May take over-the-counter Mucinex DM and use Hycodan syrup at bedtime Advised to rest and stay  well-hydrated COVID instructions given. Advised to contact the office if no better or worse during the next several days.  I discussed the assessment and treatment plan with the patient. The patient was provided an opportunity to ask questions and all were answered. The patient agreed with the plan and demonstrated an understanding of the instructions.   The patient was advised to call back or seek an in-person evaluation if the symptoms worsen or if the condition fails to improve as anticipated.  I provided 20 minutes of non-face-to-face time during this encounter.  Horald Pollen, MD  Primary Care at [facility]

## 2022-10-12 ENCOUNTER — Ambulatory Visit (INDEPENDENT_AMBULATORY_CARE_PROVIDER_SITE_OTHER): Payer: BC Managed Care – PPO | Admitting: Internal Medicine

## 2022-10-12 ENCOUNTER — Encounter: Payer: Self-pay | Admitting: Internal Medicine

## 2022-10-12 VITALS — BP 128/82 | HR 111 | Temp 97.8°F | Ht 71.0 in | Wt 248.0 lb

## 2022-10-12 DIAGNOSIS — U071 COVID-19: Secondary | ICD-10-CM | POA: Insufficient documentation

## 2022-10-12 DIAGNOSIS — E6609 Other obesity due to excess calories: Secondary | ICD-10-CM

## 2022-10-12 DIAGNOSIS — Z6831 Body mass index (BMI) 31.0-31.9, adult: Secondary | ICD-10-CM | POA: Diagnosis not present

## 2022-10-12 DIAGNOSIS — Z0000 Encounter for general adult medical examination without abnormal findings: Secondary | ICD-10-CM

## 2022-10-12 LAB — CBC WITH DIFFERENTIAL/PLATELET
Basophils Absolute: 0.1 10*3/uL (ref 0.0–0.1)
Basophils Relative: 0.4 % (ref 0.0–3.0)
Eosinophils Absolute: 0.3 10*3/uL (ref 0.0–0.7)
Eosinophils Relative: 2.5 % (ref 0.0–5.0)
HCT: 46.3 % (ref 39.0–52.0)
Hemoglobin: 16 g/dL (ref 13.0–17.0)
Lymphocytes Relative: 22.9 % (ref 12.0–46.0)
Lymphs Abs: 2.8 10*3/uL (ref 0.7–4.0)
MCHC: 34.5 g/dL (ref 30.0–36.0)
MCV: 83.6 fl (ref 78.0–100.0)
Monocytes Absolute: 1.2 10*3/uL — ABNORMAL HIGH (ref 0.1–1.0)
Monocytes Relative: 9.5 % (ref 3.0–12.0)
Neutro Abs: 8 10*3/uL — ABNORMAL HIGH (ref 1.4–7.7)
Neutrophils Relative %: 64.7 % (ref 43.0–77.0)
Platelets: 221 10*3/uL (ref 150.0–400.0)
RBC: 5.54 Mil/uL (ref 4.22–5.81)
RDW: 14.2 % (ref 11.5–15.5)
WBC: 12.4 10*3/uL — ABNORMAL HIGH (ref 4.0–10.5)

## 2022-10-12 LAB — TSH: TSH: 2.68 u[IU]/mL (ref 0.35–5.50)

## 2022-10-12 LAB — URINALYSIS
Bilirubin Urine: NEGATIVE
Hgb urine dipstick: NEGATIVE
Ketones, ur: NEGATIVE
Leukocytes,Ua: NEGATIVE
Nitrite: NEGATIVE
Specific Gravity, Urine: 1.025 (ref 1.000–1.030)
Total Protein, Urine: NEGATIVE
Urine Glucose: NEGATIVE
Urobilinogen, UA: 0.2 (ref 0.0–1.0)
pH: 5.5 (ref 5.0–8.0)

## 2022-10-12 LAB — COMPREHENSIVE METABOLIC PANEL
ALT: 36 U/L (ref 0–53)
AST: 17 U/L (ref 0–37)
Albumin: 4.5 g/dL (ref 3.5–5.2)
Alkaline Phosphatase: 60 U/L (ref 39–117)
BUN: 18 mg/dL (ref 6–23)
CO2: 29 mEq/L (ref 19–32)
Calcium: 9.5 mg/dL (ref 8.4–10.5)
Chloride: 105 mEq/L (ref 96–112)
Creatinine, Ser: 0.91 mg/dL (ref 0.40–1.50)
GFR: 94.28 mL/min (ref >60.00)
Glucose, Bld: 88 mg/dL (ref 70–99)
Potassium: 4 mEq/L (ref 3.5–5.1)
Sodium: 144 mEq/L (ref 135–145)
Total Bilirubin: 0.5 mg/dL (ref 0.2–1.2)
Total Protein: 7.1 g/dL (ref 6.0–8.3)

## 2022-10-12 LAB — LDL CHOLESTEROL, DIRECT: Direct LDL: 118 mg/dL

## 2022-10-12 LAB — LIPID PANEL
Cholesterol: 194 mg/dL (ref 0–200)
HDL: 37.9 mg/dL — ABNORMAL LOW (ref >39.00)
NonHDL: 155.87
Total CHOL/HDL Ratio: 5
Triglycerides: 227 mg/dL — ABNORMAL HIGH (ref 0.0–149.0)
VLDL: 45.4 mg/dL — ABNORMAL HIGH (ref 0.0–40.0)

## 2022-10-12 LAB — PSA: PSA: 0.47 ng/mL (ref 0.10–4.00)

## 2022-10-12 MED ORDER — LISINOPRIL-HYDROCHLOROTHIAZIDE 10-12.5 MG PO TABS: 1.0000 | ORAL_TABLET | Freq: Every day | ORAL | 3 refills | Status: DC

## 2022-10-12 MED ORDER — WEGOVY 0.25 MG/0.5ML ~~LOC~~ SOAJ: 0.2500 mg | SUBCUTANEOUS | 2 refills | Status: DC

## 2022-10-12 MED ORDER — GABAPENTIN 300 MG PO CAPS: 300.0000 mg | ORAL_CAPSULE | Freq: Every day | ORAL | 3 refills | Status: DC

## 2022-10-12 NOTE — Assessment & Plan Note (Signed)
[facility] Rx if available

## 2022-10-12 NOTE — Progress Notes (Signed)
Subjective:  Patient ID: Jon King, male    DOB: 07-29-1966  Age: 56 y.o. MRN: [id number]  CC: Annual Exam   HPI Jon King presents for a well exam Pt had COVID last week  Outpatient Medications Prior to Visit  Medication Sig Dispense Refill   diltiazem 2 % GEL Diltiazem 2% gel Apply a pea sized amount internally two times daily as needed Dispense 30 GM zero refill 30 g 0   diphenoxylate-atropine (LOMOTIL) 2.5-0.025 MG tablet Take 1 tablet by mouth 3 (three) times daily as needed for diarrhea or loose stools. 60 tablet 1   HYDROcodone bit-homatropine (HYCODAN) 5-1.5 MG/5ML syrup Take 5 mLs by mouth at bedtime as needed for cough. 120 mL 0   Multiple Vitamin (MULTIVITAMIN) tablet Take 1 tablet by mouth daily.     nirmatrelvir/ritonavir EUA (PAXLOVID) 20 x 150 MG & 10 x '100MG'$  TABS Take 3 tablets by mouth 2 (two) times daily for 5 days. (Take nirmatrelvir 150 mg two tablets twice daily for 5 days and ritonavir 100 mg one tablet twice daily for 5 days) Patient GFR is 94 30 tablet 0   Omega-3 Fatty Acids (FISH OIL) 600 MG CAPS 1capsule bid 60 capsule 11   gabapentin (NEURONTIN) 300 MG capsule Take 1 capsule (300 mg total) by mouth at bedtime. 90 capsule 5   lisinopril-hydrochlorothiazide (ZESTORETIC) 10-12.5 MG tablet Take 1 tablet by mouth once daily 90 tablet 3   No facility-administered medications prior to visit.    ROS: Review of Systems  Constitutional:  Negative for appetite change, fatigue and unexpected weight change.  HENT:  Negative for congestion, nosebleeds, sneezing, sore throat and trouble swallowing.   Eyes:  Negative for itching and visual disturbance.  Respiratory:  Negative for cough.   Cardiovascular:  Negative for chest pain, palpitations and leg swelling.  Gastrointestinal:  Negative for abdominal distention, blood in stool, diarrhea and nausea.  Genitourinary:  Negative for frequency and hematuria.  Musculoskeletal:  Negative for back pain, gait problem,  joint swelling and neck pain.  Skin:  Negative for rash.  Neurological:  Negative for dizziness, tremors, speech difficulty and weakness.  Psychiatric/Behavioral:  Negative for agitation, dysphoric mood and sleep disturbance. The patient is not nervous/anxious.     Objective:  BP 128/82 (BP Location: Left Arm, Patient Position: Sitting, Cuff Size: Normal)   Pulse (!) 111   Temp 97.8 F (36.6 C) (Oral)   Ht '5\' 11"'$  (1.803 m)   Wt 248 lb (112.5 kg)   SpO2 97%   BMI 34.59 kg/m   BP Readings from Last 3 Encounters:  10/12/22 128/82  10/06/21 122/78  08/21/21 126/90    Wt Readings from Last 3 Encounters:  10/12/22 248 lb (112.5 kg)  10/06/21 246 lb (111.6 kg)  08/21/21 236 lb 8 oz (107.3 kg)    Physical Exam Constitutional:      General: He is not in acute distress.    Appearance: He is well-developed. He is obese.     Comments: NAD  Eyes:     Conjunctiva/sclera: Conjunctivae normal.     Pupils: Pupils are equal, round, and reactive to light.  Neck:     Thyroid: No thyromegaly.     Vascular: No JVD.  Cardiovascular:     Rate and Rhythm: Normal rate and regular rhythm.     Heart sounds: Normal heart sounds. No murmur heard.    No friction rub. No gallop.  Pulmonary:     Effort: Pulmonary effort  is normal. No respiratory distress.     Breath sounds: Normal breath sounds. No wheezing or rales.  Chest:     Chest wall: No tenderness.  Abdominal:     General: Bowel sounds are normal. There is no distension.     Palpations: Abdomen is soft. There is no mass.     Tenderness: There is no abdominal tenderness. There is no guarding or rebound.  Musculoskeletal:        General: No tenderness. Normal range of motion.     Cervical back: Normal range of motion.  Lymphadenopathy:     Cervical: No cervical adenopathy.  Skin:    General: Skin is warm and dry.     Findings: No rash.  Neurological:     Mental Status: He is alert and oriented to person, place, and time.      Cranial Nerves: No cranial nerve deficit.     Motor: No abnormal muscle tone.     Coordination: Coordination normal.     Gait: Gait normal.     Deep Tendon Reflexes: Reflexes are normal and symmetric.  Psychiatric:        Behavior: Behavior normal.        Thought Content: Thought content normal.        Judgment: Judgment normal.   Rectal deffered  Lab Results  Component Value Date   WBC 10.1 10/06/2021   HGB 14.7 10/06/2021   HCT 43.6 10/06/2021   PLT 213.0 10/06/2021   GLUCOSE 83 10/06/2021   CHOL 182 10/06/2021   TRIG 379.0 (H) 10/06/2021   HDL 36.40 (L) 10/06/2021   LDLDIRECT 103.0 10/06/2021   ALT 28 10/06/2021   AST 18 10/06/2021   NA 141 10/06/2021   K 3.8 10/06/2021   CL 104 10/06/2021   CREATININE 0.91 10/06/2021   BUN 17 10/06/2021   CO2 27 10/06/2021   TSH 2.45 10/06/2021   PSA 0.41 10/06/2021    CT CARDIAC SCORING  Addendum Date: 10/24/2018   ADDENDUM REPORT: 10/24/2018 13:57 CLINICAL DATA:  Risk stratification EXAM: Coronary Calcium Score TECHNIQUE: The patient was scanned on a Siemens Somatom 64 slice scanner. Axial non-contrast 3 mm slices were carried out through the heart. The data set was analyzed on a dedicated work station and scored using the [place]. FINDINGS: Non-cardiac: See separate report from [facility]. Ascending aorta: Normal diameter 3.4 Pericardium: Normal Coronary arteries: No calcium noted IMPRESSION: Coronary calcium score of 0. [name] Electronically Signed   By: [name] M.D.   On: 10/24/2018 13:57   Result Date: 10/24/2018 EXAM: OVER-READ INTERPRETATION  CT CHEST The following report is an over-read performed by radiologist Dr. Markus Daft of [facility], [place] on 10/24/2018. This over-read does not include interpretation of cardiac or coronary anatomy or pathology. The coronary calcium score/coronary CTA interpretation by the cardiologist is attached. COMPARISON:  None. FINDINGS: Vascular: Normal caliber of  the visualized thoracic aorta without atherosclerotic calcifications. Heart size is normal with minimal pericardial fluid. Normal caliber of the main pulmonary arteries. Mediastinum/Nodes: Visualized mediastinal and hilar tissue is unremarkable. Lungs/Pleura: No pleural effusions. 4 mm peripheral nodular density in the right middle lobe on sequence 5 image 24. There is concern for mild centrilobular emphysema. Mild scarring or atelectasis along the medial right middle lobe. No large areas of airspace disease or consolidation in the visualized lungs. Focal thickening along the right minor fissure on sequence 5, image 13. Upper Abdomen: Images of the upper abdomen are unremarkable. Musculoskeletal: Visualized  bone structures are unremarkable. IMPRESSION: 1. No acute findings. 2. Concern for mild centrilobular emphysema. 3. Indeterminate 4 mm nodule in the right middle lobe. No follow-up needed if patient is low-risk. Non-contrast chest CT can be considered in 12 months if patient is high-risk. This recommendation follows the consensus statement: Guidelines for Management of Incidental Pulmonary Nodules Detected on CT Images: From the Fleischner Society 2017; Radiology 2017; 284:228-243. Electronically Signed: By: Markus Daft M.D. On: 10/24/2018 08:59    Assessment & Plan:   Problem List Items Addressed This Visit     Well adult exam    We discussed age appropriate health related issues, including available/recomended screening tests and vaccinations. We discussed a need for adhering to healthy diet and exercise. Labs were ordered to be later reviewed . All questions were answered. Shingrix advised 2019 Coronary calcium score of 0. Colon due in 2027, last one in Jan 2022      Obese - Primary    [facility] Rx if available      Relevant Medications   Semaglutide-Weight Management (WEGOVY) 0.25 MG/0.5ML SOAJ   COVID-19    recovering         Meds ordered this encounter  Medications    Semaglutide-Weight Management (WEGOVY) 0.25 MG/0.5ML SOAJ    Sig: Inject 0.25 mg into the skin once a week.    Dispense:  2 mL    Refill:  2   gabapentin (NEURONTIN) 300 MG capsule    Sig: Take 1 capsule (300 mg total) by mouth at bedtime.    Dispense:  90 capsule    Refill:  3   lisinopril-hydrochlorothiazide (ZESTORETIC) 10-12.5 MG tablet    Sig: Take 1 tablet by mouth daily.    Dispense:  90 tablet    Refill:  3      Follow-up: Return in about 3 months (around 01/11/2023) for a follow-up visit.  Walker Kehr, MD

## 2022-10-12 NOTE — Assessment & Plan Note (Signed)
We discussed age appropriate health related issues, including available/recomended screening tests and vaccinations. We discussed a need for adhering to healthy diet and exercise. Labs were ordered to be later reviewed . All questions were answered. Shingrix advised 2019 Coronary calcium score of 0. Colon due in 2027, last one in Jan 2022

## 2022-10-12 NOTE — Assessment & Plan Note (Signed)
recovering 

## 2022-12-22 DIAGNOSIS — Z08 Encounter for follow-up examination after completed treatment for malignant neoplasm: Secondary | ICD-10-CM | POA: Diagnosis not present

## 2022-12-22 DIAGNOSIS — L570 Actinic keratosis: Secondary | ICD-10-CM | POA: Diagnosis not present

## 2022-12-22 DIAGNOSIS — D225 Melanocytic nevi of trunk: Secondary | ICD-10-CM | POA: Diagnosis not present

## 2022-12-22 DIAGNOSIS — B078 Other viral warts: Secondary | ICD-10-CM | POA: Diagnosis not present

## 2022-12-22 DIAGNOSIS — Z86006 Personal history of melanoma in-situ: Secondary | ICD-10-CM | POA: Diagnosis not present

## 2022-12-22 DIAGNOSIS — Z1283 Encounter for screening for malignant neoplasm of skin: Secondary | ICD-10-CM | POA: Diagnosis not present

## 2022-12-22 DIAGNOSIS — X32XXXD Exposure to sunlight, subsequent encounter: Secondary | ICD-10-CM | POA: Diagnosis not present

## 2023-03-31 DIAGNOSIS — Z08 Encounter for follow-up examination after completed treatment for malignant neoplasm: Secondary | ICD-10-CM | POA: Diagnosis not present

## 2023-03-31 DIAGNOSIS — Z1283 Encounter for screening for malignant neoplasm of skin: Secondary | ICD-10-CM | POA: Diagnosis not present

## 2023-03-31 DIAGNOSIS — Z85820 Personal history of malignant melanoma of skin: Secondary | ICD-10-CM | POA: Diagnosis not present

## 2023-03-31 DIAGNOSIS — D225 Melanocytic nevi of trunk: Secondary | ICD-10-CM | POA: Diagnosis not present

## 2023-03-31 DIAGNOSIS — L570 Actinic keratosis: Secondary | ICD-10-CM | POA: Diagnosis not present

## 2023-03-31 DIAGNOSIS — X32XXXD Exposure to sunlight, subsequent encounter: Secondary | ICD-10-CM | POA: Diagnosis not present

## 2023-07-02 DIAGNOSIS — H04123 Dry eye syndrome of bilateral lacrimal glands: Secondary | ICD-10-CM | POA: Diagnosis not present

## 2023-07-02 DIAGNOSIS — H43392 Other vitreous opacities, left eye: Secondary | ICD-10-CM | POA: Diagnosis not present

## 2023-07-02 DIAGNOSIS — H35032 Hypertensive retinopathy, left eye: Secondary | ICD-10-CM | POA: Diagnosis not present

## 2023-07-02 DIAGNOSIS — H43812 Vitreous degeneration, left eye: Secondary | ICD-10-CM | POA: Diagnosis not present

## 2023-07-07 DIAGNOSIS — Z1283 Encounter for screening for malignant neoplasm of skin: Secondary | ICD-10-CM | POA: Diagnosis not present

## 2023-07-07 DIAGNOSIS — Z08 Encounter for follow-up examination after completed treatment for malignant neoplasm: Secondary | ICD-10-CM | POA: Diagnosis not present

## 2023-07-07 DIAGNOSIS — Z85820 Personal history of malignant melanoma of skin: Secondary | ICD-10-CM | POA: Diagnosis not present

## 2023-07-28 DIAGNOSIS — R070 Pain in throat: Secondary | ICD-10-CM | POA: Diagnosis not present

## 2023-07-28 DIAGNOSIS — R0981 Nasal congestion: Secondary | ICD-10-CM | POA: Diagnosis not present

## 2023-07-28 DIAGNOSIS — R051 Acute cough: Secondary | ICD-10-CM | POA: Diagnosis not present

## 2023-07-28 DIAGNOSIS — J069 Acute upper respiratory infection, unspecified: Secondary | ICD-10-CM | POA: Diagnosis not present

## 2023-08-02 DIAGNOSIS — H43392 Other vitreous opacities, left eye: Secondary | ICD-10-CM | POA: Diagnosis not present

## 2023-08-02 DIAGNOSIS — H43812 Vitreous degeneration, left eye: Secondary | ICD-10-CM | POA: Diagnosis not present

## 2023-09-12 ENCOUNTER — Emergency Department (HOSPITAL_COMMUNITY)
Admission: EM | Admit: 2023-09-12 | Discharge: 2023-09-12 | Disposition: A | Discharge status: Home / Self Care | Payer: BC Managed Care – PPO | Attending: Emergency Medicine | Admitting: Emergency Medicine

## 2023-09-12 ENCOUNTER — Emergency Department (HOSPITAL_COMMUNITY): Payer: BC Managed Care – PPO

## 2023-09-12 ENCOUNTER — Encounter (HOSPITAL_COMMUNITY): Payer: Self-pay

## 2023-09-12 DIAGNOSIS — R109 Unspecified abdominal pain: Secondary | ICD-10-CM | POA: Diagnosis not present

## 2023-09-12 DIAGNOSIS — R160 Hepatomegaly, not elsewhere classified: Secondary | ICD-10-CM | POA: Diagnosis not present

## 2023-09-12 DIAGNOSIS — N23 Unspecified renal colic: Secondary | ICD-10-CM | POA: Insufficient documentation

## 2023-09-12 DIAGNOSIS — K4090 Unilateral inguinal hernia, without obstruction or gangrene, not specified as recurrent: Secondary | ICD-10-CM | POA: Diagnosis not present

## 2023-09-12 DIAGNOSIS — N139 Obstructive and reflux uropathy, unspecified: Secondary | ICD-10-CM | POA: Diagnosis not present

## 2023-09-12 DIAGNOSIS — N201 Calculus of ureter: Secondary | ICD-10-CM | POA: Diagnosis not present

## 2023-09-12 LAB — URINALYSIS, ROUTINE W REFLEX MICROSCOPIC
Bilirubin Urine: NEGATIVE
Glucose, UA: NEGATIVE mg/dL
Ketones, ur: 5 mg/dL — AB
Nitrite: NEGATIVE
Protein, ur: NEGATIVE mg/dL
Specific Gravity, Urine: 1.017 (ref 1.005–1.030)
pH: 5 (ref 5.0–8.0)

## 2023-09-12 MED ORDER — ONDANSETRON HCL 4 MG PO TABS: 4.0000 mg | ORAL_TABLET | Freq: Four times a day (QID) | ORAL | 0 refills | Status: DC

## 2023-09-12 MED ORDER — MORPHINE SULFATE (PF) 4 MG/ML IV SOLN
4.0000 mg | Freq: Once | INTRAVENOUS | Status: AC
  Administered 2023-09-12: 4 mg via INTRAVENOUS
  Filled 2023-09-12: qty 1

## 2023-09-12 MED ORDER — ONDANSETRON HCL 4 MG/2ML IJ SOLN
4.0000 mg | Freq: Once | INTRAMUSCULAR | Status: AC
  Administered 2023-09-12: 4 mg via INTRAVENOUS
  Filled 2023-09-12: qty 2

## 2023-09-12 MED ORDER — OXYCODONE-ACETAMINOPHEN 5-325 MG PO TABS: 1.0000 | ORAL_TABLET | Freq: Four times a day (QID) | ORAL | 0 refills | Status: DC | PRN

## 2023-09-12 NOTE — ED Triage Notes (Signed)
Pt c/o R flank pain and nausea starting yesterday.  Pain score 9/10.  Hx of kidney stones.

## 2023-09-12 NOTE — ED Provider Notes (Signed)
EMERGENCY DEPARTMENT AT [facility] Provider Note   CSN: [id number] Arrival date & time: 09/12/23  1525     History  Chief Complaint  Patient presents with   Flank Pain    Jon King is a 57 y.o. male.  Patient to ED with difficulty urinating, producing small volume, starting yesterday. Today he had onset of right flank pain radiating to groin. No testicular pain or swelling. No fever. He reports nausea and vomiting. History of kidney stones with first episode in 2014 when lithotripsy was required for a large stone. Since then he has infrequent episodes where he will pass a stone without difficulty. Not anticoagulated.   The history is provided by the patient. No language interpreter was used.  Flank Pain       Home Medications Prior to Admission medications   Medication Sig Start Date End Date Taking? Authorizing Provider  ondansetron (ZOFRAN) 4 MG tablet Take 1 tablet (4 mg total) by mouth every 6 (six) hours. 09/12/23  Yes Elpidio Anis, PA-C  oxyCODONE-acetaminophen (PERCOCET/ROXICET) 5-325 MG tablet Take 1 tablet by mouth every 6 (six) hours as needed for severe pain (pain score 7-10). 09/12/23  Yes Elpidio Anis, PA-C  diltiazem 2 % GEL Diltiazem 2% gel Apply a pea sized amount internally two times daily as needed Dispense 30 GM zero refill 02/10/21   Pyrtle, Carie Caddy, MD  diphenoxylate-atropine (LOMOTIL) 2.5-0.025 MG tablet Take 1 tablet by mouth 3 (three) times daily as needed for diarrhea or loose stools. 08/04/21   Pyrtle, Carie Caddy, MD  gabapentin (NEURONTIN) 300 MG capsule Take 1 capsule (300 mg total) by mouth at bedtime. 10/12/22   Plotnikov, Georgina Quint, MD  HYDROcodone bit-homatropine (HYCODAN) 5-1.5 MG/5ML syrup Take 5 mLs by mouth at bedtime as needed for cough. 10/08/22   Georgina Quint, MD  lisinopril-hydrochlorothiazide (ZESTORETIC) 10-12.5 MG tablet Take 1 tablet by mouth daily. 10/12/22   Plotnikov, Georgina Quint, MD  Multiple Vitamin  (MULTIVITAMIN) tablet Take 1 tablet by mouth daily.    [provider]  Omega-3 Fatty Acids (FISH OIL) 600 MG CAPS 1capsule bid 10/08/21   Plotnikov, Georgina Quint, MD  Semaglutide-Weight Management (WEGOVY) 0.25 MG/0.5ML SOAJ Inject 0.25 mg into the skin once a week. 10/12/22   Plotnikov, Georgina Quint, MD      Allergies    Patient has no known allergies.    Review of Systems   Review of Systems  Genitourinary:  Positive for flank pain.    Physical Exam Updated Vital Signs BP (!) 147/101 (BP Location: Left Arm)   Pulse 99   Temp 98.1 F (36.7 C) (Oral)   Resp 20   Ht 5\' 9"  (1.753 m)   Wt 120.2 kg   SpO2 96%   BMI 39.13 kg/m  Physical Exam Vitals and nursing note reviewed.  Constitutional:      Appearance: Normal appearance. He is obese.     Comments: Uncomfortable appearing.  Cardiovascular:     Rate and Rhythm: Normal rate.  Pulmonary:     Effort: Pulmonary effort is normal.  Abdominal:     General: There is no distension.     Palpations: Abdomen is soft.     Tenderness: There is no abdominal tenderness.  Genitourinary:    Penis: Normal.      Testes: Normal.  Musculoskeletal:        General: Normal range of motion.  Skin:    General: Skin is warm and dry.  Neurological:  Mental Status: He is alert and oriented to person, place, and time.     ED Results / Procedures / Treatments   Labs (all labs ordered are listed, but only abnormal results are displayed) Labs Reviewed  URINALYSIS, ROUTINE W REFLEX MICROSCOPIC - Abnormal; Notable for the following components:      Result Value   Color, Urine YELLOW (*)    APPearance TURBID (*)    Hgb urine dipstick SMALL (*)    Ketones, ur 5 (*)    Leukocytes,Ua TRACE (*)    Bacteria, UA RARE (*)    All other components within normal limits   Results for orders placed or performed during the hospital encounter of 09/12/23  Urinalysis, Routine w reflex microscopic -Urine, Clean Catch  Result Value Ref Range    Color, Urine YELLOW (A) YELLOW   APPearance TURBID (A) CLEAR   Specific Gravity, Urine 1.017 1.005 - 1.030   pH 5.0 5.0 - 8.0   Glucose, UA NEGATIVE NEGATIVE mg/dL   Hgb urine dipstick SMALL (A) NEGATIVE   Bilirubin Urine NEGATIVE NEGATIVE   Ketones, ur 5 (A) NEGATIVE mg/dL   Protein, ur NEGATIVE NEGATIVE mg/dL   Nitrite NEGATIVE NEGATIVE   Leukocytes,Ua TRACE (A) NEGATIVE   RBC / HPF 0-5 0 - 5 RBC/hpf   WBC, UA 0-5 0 - 5 WBC/hpf   Bacteria, UA RARE (A) NONE SEEN   Squamous Epithelial / HPF 0-5 0 - 5 /HPF   Mucus PRESENT    Ca Oxalate Crys, UA PRESENT     EKG None  Radiology CT Renal Stone Study  Result Date: 09/12/2023 CLINICAL DATA:  Right flank pain and nausea EXAM: CT ABDOMEN AND PELVIS WITHOUT CONTRAST TECHNIQUE: Multidetector CT imaging of the abdomen and pelvis was performed following the standard protocol without IV contrast. RADIATION DOSE REDUCTION: This exam was performed according to the departmental dose-optimization program which includes automated exposure control, adjustment of the mA and/or kV according to patient size and/or use of iterative reconstruction technique. COMPARISON:  05/03/2013 FINDINGS: Portions of the anterior abdomen are excluded by field of view limitations. Lower chest: No acute pleural or parenchymal lung disease. Hepatobiliary: The liver is enlarged, measuring approximately 22 cm in craniocaudal length. No focal liver abnormalities on this unenhanced exam. The gallbladder is unremarkable. Pancreas: Unremarkable unenhanced appearance. Spleen: Unremarkable unenhanced appearance. Adrenals/Urinary Tract: There is an obstructing 4 mm distal right ureteral calculus, reference image 78/2. There is mild to moderate right-sided obstructive uropathy, with right perinephric and Peri ureteral fat stranding. Superimposed infection cannot be excluded. There are additional punctate less than 2 mm bilateral renal calculi. No left-sided obstruction. The adrenals and  bladder are unremarkable. Stomach/Bowel: No bowel obstruction or ileus. Normal appendix right lower quadrant. No bowel wall thickening or inflammatory change. Vascular/Lymphatic: Aortic atherosclerosis. No enlarged abdominal or pelvic lymph nodes. Reproductive: Prostate is unremarkable. Other: No free fluid or free intraperitoneal gas. Fat containing left inguinal hernia. Musculoskeletal: No acute or destructive bony abnormalities. Reconstructed images demonstrate no additional findings. IMPRESSION: 1. Obstructing 4 mm distal right ureteral calculus, with mild to moderate right-sided obstructive uropathy. Right perinephric and Peri ureteral fat stranding are noted, and superimposed infection cannot be excluded. 2. Other punctate less than 2 mm nonobstructing bilateral renal calculi. 3. Hepatomegaly. 4. Small fat containing left inguinal hernia.  No bowel herniation. 5.  Aortic Atherosclerosis (ICD10-I70.0). Electronically Signed   By: Sharlet Salina M.D.   On: 09/12/2023 18:13    Procedures Procedures    Medications  Ordered in ED Medications  morphine (PF) 4 MG/ML injection 4 mg (4 mg Intravenous Given 09/12/23 1650)  ondansetron (ZOFRAN) injection 4 mg (4 mg Intravenous Given 09/12/23 1650)  morphine (PF) 4 MG/ML injection 4 mg (4 mg Intravenous Given 09/12/23 1916)    ED Course/ Medical Decision Making/ A&P Clinical Course as of 09/12/23 1934  Sun Sep 12, 2023  1906 Pain starting to come back. Additional morphine provided. UA remains pending. Checked with the lab to request test results.  [SU]    Clinical Course User Index [SU] Elpidio Anis, PA-C                                 Medical Decision Making This patient presents to the ED for concern of flank pain, this involves an extensive number of treatment options, and is a complaint that carries with it a high risk of complications and morbidity.  The differential diagnosis includes kidney stones, ureteral obstruction, pyelonephritis,  testicular torsion, appendicitis   Co morbidities that complicate the patient evaluation  History of kidney stones requiring intervention   Additional history obtained:  Additional history obtained from spouse at bedside External records from outside source obtained and reviewed including Review of chart: 2014 CT showing 8 mm right UPJ stone   Lab Tests:  I Ordered, and personally interpreted labs.  The pertinent results include:  UA negative for infection - no WBC, nitrite negative, rare bacteria   Imaging Studies ordered:  I ordered imaging studies including CT renal  I independently visualized and interpreted imaging which showed Approximate 2 -3 mm stone distal ureter at UVJ. Per Radiologist: 4 mm obstructing stone distal right ureter with visualized stranding.    Problem List / ED Course / Critical interventions / Medication management  Right flank pain in a patient with h/o stones.  Pain and nausea medication provided. CT renal ordered.   CT per radiologist: obstructing stone with periureteral fat stranding, cannot rule out infection. UA pending.   I ordered medication including morphine  for pain  Reevaluation of the patient after these medicines showed that the patient improved I have reviewed the patients home medicines and have made adjustments as needed   Social Determinants of Health:  No transportation issues.    Test / Admission - Considered:  Patient has no fever in ED. VSS. No evidence infection in urinary tract with presence of stone. Stone size is 4 mm, distal right ureter. Pain is controlled with morphine and nausea controlled with zofran. Will refer to [facility]. Strict return precautions discussed.     Amount and/or Complexity of Data Reviewed Labs: ordered. Radiology: ordered.  Risk Prescription drug management.           Final Clinical Impression(s) / ED Diagnoses Final diagnoses:  Ureterolithiasis  Ureteral colic     Rx / DC Orders ED Discharge Orders          Ordered    oxyCODONE-acetaminophen (PERCOCET/ROXICET) 5-325 MG tablet  Every 6 hours PRN        09/12/23 1932    ondansetron (ZOFRAN) 4 MG tablet  Every 6 hours        09/12/23 1932              Elpidio Anis, PA-C 09/12/23 1934    Loetta Rough, MD 09/12/23 2107

## 2023-09-12 NOTE — Discharge Instructions (Addendum)
Follow up with [facility] for recheck to ensure the stone passes without complication.   Take Percocet for pain and Zofran for nausea as prescribed.  It is important to return to the ED with any fever, uncontrolled pain or vomiting, or if you stop being able to pass urine.

## 2023-09-29 DIAGNOSIS — Z85820 Personal history of malignant melanoma of skin: Secondary | ICD-10-CM | POA: Diagnosis not present

## 2023-09-29 DIAGNOSIS — Z08 Encounter for follow-up examination after completed treatment for malignant neoplasm: Secondary | ICD-10-CM | POA: Diagnosis not present

## 2023-09-29 DIAGNOSIS — Z1283 Encounter for screening for malignant neoplasm of skin: Secondary | ICD-10-CM | POA: Diagnosis not present

## 2023-09-29 DIAGNOSIS — D225 Melanocytic nevi of trunk: Secondary | ICD-10-CM | POA: Diagnosis not present

## 2023-10-08 ENCOUNTER — Telehealth: Payer: Self-pay

## 2023-10-08 NOTE — Telephone Encounter (Signed)
Transition Care Management Unsuccessful Follow-up Telephone Call  Date of discharge and from where:  09/12/2023 [facility]  Attempts:  1st Attempt  Reason for unsuccessful TCM follow-up call:  Left voice message  Ilissa Rosner [name]  [facility], [facility] Resource Care Guide Direct Dial: [phone]  Website: [email or web address]

## 2023-10-08 NOTE — Telephone Encounter (Signed)
Transition Care Management Unsuccessful Follow-up Telephone Call  Date of discharge and from where:  09/12/2023 [facility]  Attempts:  2nd Attempt  Reason for unsuccessful TCM follow-up call:  Left voice message  Jon King [name]  [facility], [facility] Resource Care Guide Direct Dial: [phone]  Website: [email or web address]

## 2023-10-27 ENCOUNTER — Other Ambulatory Visit: Payer: Self-pay | Admitting: Internal Medicine

## 2023-11-03 ENCOUNTER — Ambulatory Visit: Payer: Managed Care, Other (non HMO) | Admitting: Internal Medicine

## 2023-11-03 ENCOUNTER — Encounter: Payer: Self-pay | Admitting: Internal Medicine

## 2023-11-03 VITALS — BP 124/80 | HR 104 | Temp 98.0°F | Ht 69.0 in | Wt 265.0 lb

## 2023-11-03 DIAGNOSIS — R051 Acute cough: Secondary | ICD-10-CM | POA: Diagnosis not present

## 2023-11-03 DIAGNOSIS — Z13220 Encounter for screening for lipoid disorders: Secondary | ICD-10-CM

## 2023-11-03 DIAGNOSIS — D126 Benign neoplasm of colon, unspecified: Secondary | ICD-10-CM | POA: Diagnosis not present

## 2023-11-03 DIAGNOSIS — U071 COVID-19: Secondary | ICD-10-CM | POA: Diagnosis not present

## 2023-11-03 DIAGNOSIS — Z125 Encounter for screening for malignant neoplasm of prostate: Secondary | ICD-10-CM | POA: Diagnosis not present

## 2023-11-03 DIAGNOSIS — Z0001 Encounter for general adult medical examination with abnormal findings: Secondary | ICD-10-CM | POA: Diagnosis not present

## 2023-11-03 DIAGNOSIS — Z0000 Encounter for general adult medical examination without abnormal findings: Secondary | ICD-10-CM

## 2023-11-03 LAB — CBC WITH DIFFERENTIAL/PLATELET
Basophils Absolute: 0.2 10*3/uL — ABNORMAL HIGH (ref 0.0–0.1)
Basophils Relative: 1.5 % (ref 0.0–3.0)
Eosinophils Absolute: 0.3 10*3/uL (ref 0.0–0.7)
Eosinophils Relative: 1.9 % (ref 0.0–5.0)
HCT: 45 % (ref 39.0–52.0)
Hemoglobin: 14.8 g/dL (ref 13.0–17.0)
Lymphocytes Relative: 19.5 % (ref 12.0–46.0)
Lymphs Abs: 2.8 10*3/uL (ref 0.7–4.0)
MCHC: 32.8 g/dL (ref 30.0–36.0)
MCV: 85.7 fL (ref 78.0–100.0)
Monocytes Absolute: 1.4 10*3/uL — ABNORMAL HIGH (ref 0.1–1.0)
Monocytes Relative: 10.3 % (ref 3.0–12.0)
Neutro Abs: 9.4 10*3/uL — ABNORMAL HIGH (ref 1.4–7.7)
Neutrophils Relative %: 66.8 % (ref 43.0–77.0)
Platelets: 239 10*3/uL (ref 150.0–400.0)
RBC: 5.26 Mil/uL (ref 4.22–5.81)
RDW: 14.4 % (ref 11.5–15.5)
WBC: 14.1 10*3/uL — ABNORMAL HIGH (ref 4.0–10.5)

## 2023-11-03 LAB — COMPREHENSIVE METABOLIC PANEL
ALT: 41 U/L (ref 0–53)
AST: 22 U/L (ref 0–37)
Albumin: 4.2 g/dL (ref 3.5–5.2)
Alkaline Phosphatase: 66 U/L (ref 39–117)
BUN: 24 mg/dL — ABNORMAL HIGH (ref 6–23)
CO2: 29 meq/L (ref 19–32)
Calcium: 9.4 mg/dL (ref 8.4–10.5)
Chloride: 102 meq/L (ref 96–112)
Creatinine, Ser: 0.83 mg/dL (ref 0.40–1.50)
GFR: 97.17 mL/min (ref >60.00)
Glucose, Bld: 84 mg/dL (ref 70–99)
Potassium: 4.2 meq/L (ref 3.5–5.1)
Sodium: 142 meq/L (ref 135–145)
Total Bilirubin: 0.6 mg/dL (ref 0.2–1.2)
Total Protein: 6.5 g/dL (ref 6.0–8.3)

## 2023-11-03 LAB — LIPID PANEL
Cholesterol: 181 mg/dL (ref 0–200)
HDL: 36.1 mg/dL — ABNORMAL LOW (ref >39.00)
Total CHOL/HDL Ratio: 5
Triglycerides: 454 mg/dL — ABNORMAL HIGH (ref 0.0–149.0)

## 2023-11-03 LAB — TSH: TSH: 2.04 u[IU]/mL (ref 0.35–5.50)

## 2023-11-03 LAB — LDL CHOLESTEROL, DIRECT: Direct LDL: 85 mg/dL

## 2023-11-03 LAB — PSA: PSA: 0.43 ng/mL (ref 0.10–4.00)

## 2023-11-03 MED ORDER — HYDROCODONE BIT-HOMATROP MBR 5-1.5 MG/5ML PO SOLN: 5.0000 mL | Freq: Four times a day (QID) | ORAL | 0 refills | Status: DC | PRN

## 2023-11-03 MED ORDER — GABAPENTIN 300 MG PO CAPS: 300.0000 mg | ORAL_CAPSULE | Freq: Every day | ORAL | 3 refills | Status: DC

## 2023-11-03 MED ORDER — LOSARTAN POTASSIUM-HCTZ 100-12.5 MG PO TABS: 1.0000 | ORAL_TABLET | Freq: Every day | ORAL | 3 refills | Status: DC

## 2023-11-03 MED ORDER — AIRSUPRA 90-80 MCG/ACT IN AERO: 2.0000 | INHALATION_SPRAY | RESPIRATORY_TRACT | 5 refills | Status: AC | PRN

## 2023-11-03 NOTE — Assessment & Plan Note (Addendum)
 Due in 2025, now is due in 2027

## 2023-11-03 NOTE — Progress Notes (Signed)
 Subjective:  Patient ID: Jon King, male    DOB: 02-12-1966  Age: 58 y.o. MRN: [id number]  CC: Follow-up   HPI Jon King presents for a well exam C/o cough after URI  Outpatient Medications Prior to Visit  Medication Sig Dispense Refill   Multiple Vitamin (MULTIVITAMIN) tablet Take 1 tablet by mouth daily.     Omega-3 Fatty Acids (FISH OIL ) 600 MG CAPS 1capsule bid 60 capsule 11   gabapentin  (NEURONTIN ) 300 MG capsule Take 1 capsule (300 mg total) by mouth at bedtime. 90 capsule 3   lisinopril -hydrochlorothiazide  (ZESTORETIC ) 10-12.5 MG tablet Take 1 tablet by mouth once daily 90 tablet 1   diltiazem  2 % GEL Diltiazem  2% gel Apply a pea sized amount internally two times daily as needed Dispense 30 GM zero refill (Patient not taking: Reported on 11/03/2023) 30 g 0   diphenoxylate -atropine  (LOMOTIL ) 2.5-0.025 MG tablet Take 1 tablet by mouth 3 (three) times daily as needed for diarrhea or loose stools. (Patient not taking: Reported on 11/03/2023) 60 tablet 1   HYDROcodone  bit-homatropine (HYCODAN) 5-1.5 MG/5ML syrup Take 5 mLs by mouth at bedtime as needed for cough. (Patient not taking: Reported on 11/03/2023) 120 mL 0   ondansetron  (ZOFRAN ) 4 MG tablet Take 1 tablet (4 mg total) by mouth every 6 (six) hours. (Patient not taking: Reported on 11/03/2023) 12 tablet 0   oxyCODONE -acetaminophen  (PERCOCET/ROXICET) 5-325 MG tablet Take 1 tablet by mouth every 6 (six) hours as needed for severe pain (pain score 7-10). (Patient not taking: Reported on 11/03/2023) 15 tablet 0   Semaglutide -Weight Management (WEGOVY ) 0.25 MG/0.5ML SOAJ Inject 0.25 mg into the skin once a week. (Patient not taking: Reported on 11/03/2023) 2 mL 2   No facility-administered medications prior to visit.    ROS: Review of Systems  Constitutional:  Positive for fatigue. Negative for appetite change and unexpected weight change.  HENT:  Negative for congestion, nosebleeds, sneezing, sore throat and trouble swallowing.    Eyes:  Negative for itching and visual disturbance.  Respiratory:  Positive for cough.   Cardiovascular:  Negative for chest pain, palpitations and leg swelling.  Gastrointestinal:  Negative for abdominal distention, blood in stool, diarrhea and nausea.  Genitourinary:  Negative for frequency and hematuria.  Musculoskeletal:  Negative for back pain, gait problem, joint swelling and neck pain.  Skin:  Negative for rash.  Neurological:  Negative for dizziness, tremors, speech difficulty and weakness.  Hematological:  Does not bruise/bleed easily.  Psychiatric/Behavioral:  Negative for agitation, dysphoric mood, sleep disturbance and suicidal ideas. The patient is not nervous/anxious.     Objective:  BP 124/80 (BP Location: Left Arm, Patient Position: Sitting, Cuff Size: Normal)   Pulse (!) 104   Temp 98 F (36.7 C) (Oral)   Ht 5' 9 (1.753 m)   Wt 265 lb (120.2 kg)   SpO2 98%   BMI 39.13 kg/m   BP Readings from Last 3 Encounters:  11/03/23 124/80  09/12/23 (!) 139/97  10/12/22 128/82    Wt Readings from Last 3 Encounters:  11/03/23 265 lb (120.2 kg)  09/12/23 265 lb (120.2 kg)  10/12/22 248 lb (112.5 kg)    Physical Exam Constitutional:      General: He is not in acute distress.    Appearance: He is well-developed. He is obese.     Comments: NAD  Eyes:     Conjunctiva/sclera: Conjunctivae normal.     Pupils: Pupils are equal, round, and reactive to light.  Neck:     Thyroid : No thyromegaly.     Vascular: No JVD.  Cardiovascular:     Rate and Rhythm: Normal rate and regular rhythm.     Heart sounds: Normal heart sounds. No murmur heard.    No friction rub. No gallop.  Pulmonary:     Effort: Pulmonary effort is normal. No respiratory distress.     Breath sounds: Normal breath sounds. No wheezing or rales.  Chest:     Chest wall: No tenderness.  Abdominal:     General: Bowel sounds are normal. There is no distension.     Palpations: Abdomen is soft. There is no  mass.     Tenderness: There is no abdominal tenderness. There is no guarding or rebound.  Musculoskeletal:        General: No tenderness. Normal range of motion.     Cervical back: Normal range of motion.  Lymphadenopathy:     Cervical: No cervical adenopathy.  Skin:    General: Skin is warm and dry.     Findings: No rash.  Neurological:     Mental Status: He is alert and oriented to person, place, and time.     Cranial Nerves: No cranial nerve deficit.     Motor: No abnormal muscle tone.     Coordination: Coordination normal.     Gait: Gait normal.     Deep Tendon Reflexes: Reflexes are normal and symmetric.  Psychiatric:        Behavior: Behavior normal.        Thought Content: Thought content normal.        Judgment: Judgment normal.   Rectal - per GI  I spent 22 minutes in addition to time for CPX wellness examination in preparing to see the patient by review of recent labs, imaging and procedures, obtaining and reviewing separately obtained history, communicating with the patient, ordering medications, tests or procedures, and documenting clinical information in the EHR including the differential diagnosis, treatment, and any further evaluation and other management of post-COVID cough, colon polyps        Lab Results  Component Value Date   WBC 14.1 (H) 11/03/2023   HGB 14.8 11/03/2023   HCT 45.0 11/03/2023   PLT 239.0 11/03/2023   GLUCOSE 84 11/03/2023   CHOL 181 11/03/2023   TRIG (H) 11/03/2023    454.0 Triglyceride is over 400; calculations on Lipids are invalid.   HDL 36.10 (L) 11/03/2023   LDLDIRECT 85.0 11/03/2023   ALT 41 11/03/2023   AST 22 11/03/2023   NA 142 11/03/2023   K 4.2 11/03/2023   CL 102 11/03/2023   CREATININE 0.83 11/03/2023   BUN 24 (H) 11/03/2023   CO2 29 11/03/2023   TSH 2.04 11/03/2023   PSA 0.43 11/03/2023    CT Renal Stone Study Result Date: 09/12/2023 CLINICAL DATA:  Right flank pain and nausea EXAM: CT ABDOMEN AND PELVIS WITHOUT  CONTRAST TECHNIQUE: Multidetector CT imaging of the abdomen and pelvis was performed following the standard protocol without IV contrast. RADIATION DOSE REDUCTION: This exam was performed according to the departmental dose-optimization program which includes automated exposure control, adjustment of the mA and/or kV according to patient size and/or use of iterative reconstruction technique. COMPARISON:  05/03/2013 FINDINGS: Portions of the anterior abdomen are excluded by field of view limitations. Lower chest: No acute pleural or parenchymal lung disease. Hepatobiliary: The liver is enlarged, measuring approximately 22 cm in craniocaudal length. No focal liver abnormalities on this unenhanced exam.  The gallbladder is unremarkable. Pancreas: Unremarkable unenhanced appearance. Spleen: Unremarkable unenhanced appearance. Adrenals/Urinary Tract: There is an obstructing 4 mm distal right ureteral calculus, reference image 78/2. There is mild to moderate right-sided obstructive uropathy, with right perinephric and Peri ureteral fat stranding. Superimposed infection cannot be excluded. There are additional punctate less than 2 mm bilateral renal calculi. No left-sided obstruction. The adrenals and bladder are unremarkable. Stomach/Bowel: No bowel obstruction or ileus. Normal appendix right lower quadrant. No bowel wall thickening or inflammatory change. Vascular/Lymphatic: Aortic atherosclerosis. No enlarged abdominal or pelvic lymph nodes. Reproductive: Prostate is unremarkable. Other: No free fluid or free intraperitoneal gas. Fat containing left inguinal hernia. Musculoskeletal: No acute or destructive bony abnormalities. Reconstructed images demonstrate no additional findings. IMPRESSION: 1. Obstructing 4 mm distal right ureteral calculus, with mild to moderate right-sided obstructive uropathy. Right perinephric and Peri ureteral fat stranding are noted, and superimposed infection cannot be excluded. 2. Other  punctate less than 2 mm nonobstructing bilateral renal calculi. 3. Hepatomegaly. 4. Small fat containing left inguinal hernia.  No bowel herniation. 5.  Aortic Atherosclerosis (ICD10-I70.0). Electronically Signed   By: Ozell Daring M.D.   On: 09/12/2023 18:13    Assessment & Plan:   Problem List Items Addressed This Visit     Well adult exam - Primary   We discussed age appropriate health related issues, including available/recomended screening tests and vaccinations. We discussed a need for adhering to healthy diet and exercise. Labs were ordered to be later reviewed . All questions were answered. Shingrix advised 2019 Coronary calcium score of 0. Colon due in 2027, last one in Jan 2025      Relevant Orders   TSH (Completed)   Urinalysis (Completed)   CBC with Differential/Platelet (Completed)   Lipid panel (Completed)   PSA (Completed)   Comprehensive metabolic panel (Completed)   Colon polyps   Due in 2025, now is due in 2027      COVID-19   Post COVID cough.  Prescribed Hycodan and Airsupra  MDI      Acute cough   Relevant Medications   HYDROcodone  bit-homatropine (HYCODAN) 5-1.5 MG/5ML syrup      Meds ordered this encounter  Medications   losartan -hydrochlorothiazide  (HYZAAR) 100-12.5 MG tablet    Sig: Take 1 tablet by mouth daily.    Dispense:  90 tablet    Refill:  3   HYDROcodone  bit-homatropine (HYCODAN) 5-1.5 MG/5ML syrup    Sig: Take 5 mLs by mouth every 6 (six) hours as needed for cough.    Dispense:  240 mL    Refill:  0   gabapentin  (NEURONTIN ) 300 MG capsule    Sig: Take 1 capsule (300 mg total) by mouth at bedtime.    Dispense:  90 capsule    Refill:  3   Albuterol-Budesonide (AIRSUPRA ) 90-80 MCG/ACT AERO    Sig: Inhale 2 Inhalations into the lungs every 4 (four) hours as needed.    Dispense:  10 g    Refill:  5      Follow-up: Return in about 1 year (around 11/02/2024) for Wellness Exam.  Marolyn Noel, MD

## 2023-11-04 LAB — URINALYSIS
Bilirubin Urine: NEGATIVE
Hgb urine dipstick: NEGATIVE
Ketones, ur: NEGATIVE
Leukocytes,Ua: NEGATIVE
Nitrite: NEGATIVE
Specific Gravity, Urine: 1.025 (ref 1.000–1.030)
Total Protein, Urine: NEGATIVE
Urine Glucose: NEGATIVE
Urobilinogen, UA: 0.2 (ref 0.0–1.0)
pH: 5.5 (ref 5.0–8.0)

## 2023-11-08 DIAGNOSIS — R051 Acute cough: Secondary | ICD-10-CM | POA: Insufficient documentation

## 2023-11-08 NOTE — Assessment & Plan Note (Signed)
 Post COVID cough.  Prescribed Hycodan and Airsupra MDI

## 2023-11-08 NOTE — Assessment & Plan Note (Addendum)
 We discussed age appropriate health related issues, including available/recomended screening tests and vaccinations. We discussed a need for adhering to healthy diet and exercise. Labs were ordered to be later reviewed . All questions were answered. Shingrix advised 2019 Coronary calcium score of 0. Colon due in 2027, last one in Jan 2025

## 2023-11-09 ENCOUNTER — Other Ambulatory Visit: Payer: Self-pay | Admitting: Internal Medicine

## 2023-11-09 DIAGNOSIS — E781 Pure hyperglyceridemia: Secondary | ICD-10-CM

## 2023-11-09 DIAGNOSIS — D72829 Elevated white blood cell count, unspecified: Secondary | ICD-10-CM

## 2024-05-23 ENCOUNTER — Encounter: Payer: Self-pay | Admitting: Internal Medicine

## 2024-05-24 ENCOUNTER — Other Ambulatory Visit: Payer: Self-pay | Admitting: Internal Medicine

## 2024-05-24 DIAGNOSIS — I10 Essential (primary) hypertension: Secondary | ICD-10-CM | POA: Insufficient documentation

## 2024-05-24 DIAGNOSIS — I1A0 Resistant hypertension: Secondary | ICD-10-CM

## 2024-05-24 MED ORDER — OLMESARTAN-AMLODIPINE-HCTZ 40-5-12.5 MG PO TABS: 1.0000 | ORAL_TABLET | ORAL | 11 refills | Status: AC

## 2024-06-01 ENCOUNTER — Encounter: Payer: Self-pay | Admitting: Internal Medicine

## 2024-06-07 ENCOUNTER — Ambulatory Visit: Admitting: Internal Medicine

## 2024-07-05 ENCOUNTER — Encounter: Payer: Self-pay | Admitting: Internal Medicine

## 2024-07-05 ENCOUNTER — Ambulatory Visit: Admitting: Internal Medicine

## 2024-07-05 VITALS — BP 140/90 | HR 104 | Temp 98.6°F | Ht 69.0 in | Wt 267.4 lb

## 2024-07-05 DIAGNOSIS — M25569 Pain in unspecified knee: Secondary | ICD-10-CM | POA: Insufficient documentation

## 2024-07-05 DIAGNOSIS — I1A0 Resistant hypertension: Secondary | ICD-10-CM

## 2024-07-05 DIAGNOSIS — M25562 Pain in left knee: Secondary | ICD-10-CM

## 2024-07-05 DIAGNOSIS — Z23 Encounter for immunization: Secondary | ICD-10-CM | POA: Diagnosis not present

## 2024-07-05 DIAGNOSIS — R000 Tachycardia, unspecified: Secondary | ICD-10-CM

## 2024-07-05 MED ORDER — METOPROLOL SUCCINATE ER 25 MG PO TB24: 25.0000 mg | ORAL_TABLET | Freq: Every day | ORAL | 3 refills | Status: AC

## 2024-07-05 MED ORDER — PHENTERMINE HCL 37.5 MG PO TABS: 37.5000 mg | ORAL_TABLET | Freq: Every day | ORAL | 2 refills | Status: AC

## 2024-07-05 MED ORDER — INDOMETHACIN 50 MG PO CAPS: 50.0000 mg | ORAL_CAPSULE | Freq: Three times a day (TID) | ORAL | 2 refills | Status: AC

## 2024-07-05 NOTE — Patient Instructions (Addendum)
 Extreme Fit Sport Compression Socks for Men and Women Knee High - Made for Running, Athletics, Pregnancy and Travel - 6 Pair  USEFUL THINGS FOR ARTHRITIS and musculoskeletal pains:    A rice sock heating pad refers to a homemade heating pad created by filling a sock with uncooked rice, which can be heated in a microwave to provide a warm compress for sore muscles, pain relief, or other applications; essentially, it's a simple way to generate heat using readily available materials.  Key points about rice sock heat: How to make it: Fill a clean sock (preferably a tube sock) about 2/3 full with uncooked rice, tie a knot at the top to secure the rice inside.  Heating it up: Place the rice sock in the microwave and heat in short intervals (usually around 30 seconds at a time) until it reaches the desired warmth.  Important considerations: Check temperature before applying: Always test the temperature of the rice sock before applying it to your skin to avoid burns.  Use a towel to protect skin: Wrap the rice sock in a thin towel to distribute the heat evenly and protect your skin.  Uses: Muscle aches and pains  Menstrual cramps  Neck pain  Arthritis discomfort    BLUE EMU CREAM: Use it 2-3 times a day on painful areas

## 2024-07-05 NOTE — Progress Notes (Signed)
 Subjective:  Patient ID: Jon King, male    DOB: 1966-07-24  Age: 58 y.o. MRN: [id number]  CC: Hypertension (Switched medications, state bp has been better but still high. Woke up and he has had knee pain and can't lift his leg up. )   HPI Jon King presents for Hypertension (Switched medications, state bp has been better but still high. Woke up and he has had  Lknee pain and can't lift his leg up. Taking Advil - better  Outpatient Medications Prior to Visit  Medication Sig Dispense Refill   Cholecalciferol (VITAMIN D3) 50 MCG (2000 UT) capsule Take 2,000 Units by mouth daily.     diltiazem  2 % GEL Diltiazem  2% gel Apply a pea sized amount internally two times daily as needed Dispense 30 GM zero refill 30 g 0   gabapentin  (NEURONTIN ) 300 MG capsule Take 1 capsule (300 mg total) by mouth at bedtime. 90 capsule 3   Multiple Vitamin (MULTIVITAMIN) tablet Take 1 tablet by mouth daily.     Olmesartan -amLODIPine -HCTZ (TRIBENZOR) 40-5-12.5 MG TABS Take 1 tablet by mouth 1 day or 1 dose. 30 tablet 11   Albuterol-Budesonide (AIRSUPRA ) 90-80 MCG/ACT AERO Inhale 2 Inhalations into the lungs every 4 (four) hours as needed. (Patient not taking: Reported on 07/05/2024) 10 g 5   Omega-3 Fatty Acids (FISH OIL ) 600 MG CAPS 1capsule bid (Patient not taking: Reported on 07/05/2024) 60 capsule 11   No facility-administered medications prior to visit.    ROS: Review of Systems  Constitutional:  Negative for appetite change, fatigue and unexpected weight change.  HENT:  Negative for congestion, nosebleeds, sneezing, sore throat and trouble swallowing.   Eyes:  Negative for itching and visual disturbance.  Respiratory:  Negative for cough.   Cardiovascular:  Negative for chest pain, palpitations and leg swelling.  Gastrointestinal:  Negative for abdominal distention, blood in stool, diarrhea and nausea.  Genitourinary:  Negative for frequency and hematuria.  Musculoskeletal:  Positive for  arthralgias and gait problem. Negative for back pain, joint swelling and neck pain.  Skin:  Negative for rash.  Neurological:  Negative for dizziness, tremors, speech difficulty and weakness.  Psychiatric/Behavioral:  Negative for agitation, dysphoric mood and sleep disturbance. The patient is not nervous/anxious.     Objective:  BP (!) 140/90   Pulse (!) 104   Temp 98.6 F (37 C) (Oral)   Ht 5' 9 (1.753 m)   Wt 267 lb 6.4 oz (121.3 kg)   SpO2 93%   BMI 39.49 kg/m   BP Readings from Last 3 Encounters:  07/05/24 (!) 140/90  11/03/23 124/80  09/12/23 (!) 139/97    Wt Readings from Last 3 Encounters:  07/05/24 267 lb 6.4 oz (121.3 kg)  11/03/23 265 lb (120.2 kg)  09/12/23 265 lb (120.2 kg)    Physical Exam Constitutional:      General: He is not in acute distress.    Appearance: He is well-developed.     Comments: NAD  Eyes:     Conjunctiva/sclera: Conjunctivae normal.     Pupils: Pupils are equal, round, and reactive to light.  Neck:     Thyroid : No thyromegaly.     Vascular: No JVD.  Cardiovascular:     Rate and Rhythm: Normal rate and regular rhythm.     Heart sounds: Normal heart sounds. No murmur heard.    No friction rub. No gallop.  Pulmonary:     Effort: Pulmonary effort is normal. No respiratory distress.  Breath sounds: Normal breath sounds. No wheezing or rales.  Chest:     Chest wall: No tenderness.  Abdominal:     General: Bowel sounds are normal. There is no distension.     Palpations: Abdomen is soft. There is no mass.     Tenderness: There is no abdominal tenderness. There is no guarding or rebound.  Musculoskeletal:        General: Tenderness present. Normal range of motion.     Cervical back: Normal range of motion.     Right lower leg: Edema present.     Left lower leg: Edema present.  Lymphadenopathy:     Cervical: No cervical adenopathy.  Skin:    General: Skin is warm and dry.     Findings: Erythema present. No rash.  Neurological:      Mental Status: He is alert and oriented to person, place, and time.     Cranial Nerves: No cranial nerve deficit.     Motor: No abnormal muscle tone.     Coordination: Coordination normal.     Gait: Gait normal.     Deep Tendon Reflexes: Reflexes are normal and symmetric.  Psychiatric:        Behavior: Behavior normal.        Thought Content: Thought content normal.        Judgment: Judgment normal.    L knee w/pain Trace edema   Lab Results  Component Value Date   WBC 10.0 07/05/2024   HGB 14.6 07/05/2024   HCT 42.4 07/05/2024   PLT 189.0 07/05/2024   GLUCOSE 86 07/05/2024   CHOL 168 07/05/2024   TRIG (H) 07/05/2024    459.0 Triglyceride is over 400; calculations on Lipids are invalid.   HDL 30.30 (L) 07/05/2024   LDLDIRECT 88.0 07/05/2024   ALT 40 07/05/2024   AST 23 07/05/2024   NA 141 07/05/2024   K 3.7 07/05/2024   CL 104 07/05/2024   CREATININE 0.87 07/05/2024   BUN 17 07/05/2024   CO2 26 07/05/2024   TSH 2.81 07/05/2024   PSA 0.43 11/03/2023    CT Renal Stone Study Result Date: 09/12/2023 CLINICAL DATA:  Right flank pain and nausea EXAM: CT ABDOMEN AND PELVIS WITHOUT CONTRAST TECHNIQUE: Multidetector CT imaging of the abdomen and pelvis was performed following the standard protocol without IV contrast. RADIATION DOSE REDUCTION: This exam was performed according to the departmental dose-optimization program which includes automated exposure control, adjustment of the mA and/or kV according to patient size and/or use of iterative reconstruction technique. COMPARISON:  05/03/2013 FINDINGS: Portions of the anterior abdomen are excluded by field of view limitations. Lower chest: No acute pleural or parenchymal lung disease. Hepatobiliary: The liver is enlarged, measuring approximately 22 cm in craniocaudal length. No focal liver abnormalities on this unenhanced exam. The gallbladder is unremarkable. Pancreas: Unremarkable unenhanced appearance. Spleen: Unremarkable  unenhanced appearance. Adrenals/Urinary Tract: There is an obstructing 4 mm distal right ureteral calculus, reference image 78/2. There is mild to moderate right-sided obstructive uropathy, with right perinephric and Peri ureteral fat stranding. Superimposed infection cannot be excluded. There are additional punctate less than 2 mm bilateral renal calculi. No left-sided obstruction. The adrenals and bladder are unremarkable. Stomach/Bowel: No bowel obstruction or ileus. Normal appendix right lower quadrant. No bowel wall thickening or inflammatory change. Vascular/Lymphatic: Aortic atherosclerosis. No enlarged abdominal or pelvic lymph nodes. Reproductive: Prostate is unremarkable. Other: No free fluid or free intraperitoneal gas. Fat containing left inguinal hernia. Musculoskeletal: No acute or destructive  bony abnormalities. Reconstructed images demonstrate no additional findings. IMPRESSION: 1. Obstructing 4 mm distal right ureteral calculus, with mild to moderate right-sided obstructive uropathy. Right perinephric and Peri ureteral fat stranding are noted, and superimposed infection cannot be excluded. 2. Other punctate less than 2 mm nonobstructing bilateral renal calculi. 3. Hepatomegaly. 4. Small fat containing left inguinal hernia.  No bowel herniation. 5.  Aortic Atherosclerosis (ICD10-I70.0). Electronically Signed   By: Ozell Daring M.D.   On: 09/12/2023 18:13    Assessment & Plan:   Problem List Items Addressed This Visit     Hypertension   Relevant Medications   metoprolol  succinate (TOPROL -XL) 25 MG 24 hr tablet   Other Relevant Orders   Comprehensive metabolic panel with GFR (Completed)   CBC with Differential/Platelet (Completed)   Lipid panel (Completed)   Uric acid (Completed)   TSH (Completed)   Knee pain - Primary   ?gout Indocin  tid prn Labs      Relevant Orders   Uric acid (Completed)   Tachycardia   Relevant Orders   Comprehensive metabolic panel with GFR (Completed)    CBC with Differential/Platelet (Completed)   Lipid panel (Completed)   Uric acid (Completed)   TSH (Completed)   Other Visit Diagnoses       Immunization due       Relevant Orders   Flu vaccine trivalent PF, 6mos and older(Flulaval,Afluria,Fluarix,Fluzone) (Completed)         Meds ordered this encounter  Medications   indomethacin  (INDOCIN ) 50 MG capsule    Sig: Take 1 capsule (50 mg total) by mouth 3 (three) times daily with meals.    Dispense:  90 capsule    Refill:  2   metoprolol  succinate (TOPROL -XL) 25 MG 24 hr tablet    Sig: Take 1 tablet (25 mg total) by mouth daily.    Dispense:  90 tablet    Refill:  3   phentermine  (ADIPEX-P ) 37.5 MG tablet    Sig: Take 1 tablet (37.5 mg total) by mouth daily before breakfast.    Dispense:  30 tablet    Refill:  2      Follow-up: Return in about 6 weeks (around 08/16/2024) for a follow-up visit.  Marolyn Noel, MD

## 2024-07-05 NOTE — Assessment & Plan Note (Signed)
?  gout Indocin  tid prn Labs

## 2024-07-06 LAB — COMPREHENSIVE METABOLIC PANEL WITH GFR
ALT: 40 U/L (ref 0–53)
AST: 23 U/L (ref 0–37)
Albumin: 4.3 g/dL (ref 3.5–5.2)
Alkaline Phosphatase: 60 U/L (ref 39–117)
BUN: 17 mg/dL (ref 6–23)
CO2: 26 meq/L (ref 19–32)
Calcium: 9.2 mg/dL (ref 8.4–10.5)
Chloride: 104 meq/L (ref 96–112)
Creatinine, Ser: 0.87 mg/dL (ref 0.40–1.50)
GFR: 95.35 mL/min (ref >60.00)
Glucose, Bld: 86 mg/dL (ref 70–99)
Potassium: 3.7 meq/L (ref 3.5–5.1)
Sodium: 141 meq/L (ref 135–145)
Total Bilirubin: 0.7 mg/dL (ref 0.2–1.2)
Total Protein: 7.1 g/dL (ref 6.0–8.3)

## 2024-07-06 LAB — CBC WITH DIFFERENTIAL/PLATELET
Basophils Absolute: 0.2 K/uL — ABNORMAL HIGH (ref 0.0–0.1)
Basophils Relative: 2 % (ref 0.0–3.0)
Eosinophils Absolute: 0.2 K/uL (ref 0.0–0.7)
Eosinophils Relative: 2.4 % (ref 0.0–5.0)
HCT: 42.4 % (ref 39.0–52.0)
Hemoglobin: 14.6 g/dL (ref 13.0–17.0)
Lymphocytes Relative: 22.4 % (ref 12.0–46.0)
Lymphs Abs: 2.2 K/uL (ref 0.7–4.0)
MCHC: 34.4 g/dL (ref 30.0–36.0)
MCV: 82.9 fl (ref 78.0–100.0)
Monocytes Absolute: 1 K/uL (ref 0.1–1.0)
Monocytes Relative: 10 % (ref 3.0–12.0)
Neutro Abs: 6.3 K/uL (ref 1.4–7.7)
Neutrophils Relative %: 63.2 % (ref 43.0–77.0)
Platelets: 189 K/uL (ref 150.0–400.0)
RBC: 5.12 Mil/uL (ref 4.22–5.81)
RDW: 14.3 % (ref 11.5–15.5)
WBC: 10 K/uL (ref 4.0–10.5)

## 2024-07-06 LAB — TSH: TSH: 2.81 u[IU]/mL (ref 0.35–5.50)

## 2024-07-06 LAB — URIC ACID: Uric Acid, Serum: 7.8 mg/dL (ref 4.0–7.8)

## 2024-07-06 LAB — LDL CHOLESTEROL, DIRECT: Direct LDL: 88 mg/dL

## 2024-07-06 LAB — LIPID PANEL
Cholesterol: 168 mg/dL (ref 0–200)
HDL: 30.3 mg/dL — ABNORMAL LOW (ref >39.00)
NonHDL: 137.71
Total CHOL/HDL Ratio: 6
Triglycerides: 459 mg/dL — ABNORMAL HIGH (ref 0.0–149.0)
VLDL: 91.8 mg/dL — ABNORMAL HIGH (ref 0.0–40.0)

## 2024-07-11 ENCOUNTER — Ambulatory Visit: Payer: Self-pay | Admitting: Internal Medicine

## 2024-07-11 MED ORDER — ICOSAPENT ETHYL 1 G PO CAPS: 2.0000 g | ORAL_CAPSULE | Freq: Two times a day (BID) | ORAL | 11 refills | Status: AC

## 2024-08-04 ENCOUNTER — Ambulatory Visit (AMBULATORY_SURGERY_CENTER): Admitting: *Deleted

## 2024-08-04 ENCOUNTER — Telehealth: Payer: Self-pay | Admitting: *Deleted

## 2024-08-04 VITALS — Ht 69.0 in | Wt 250.0 lb

## 2024-08-04 DIAGNOSIS — Z800 Family history of malignant neoplasm of digestive organs: Secondary | ICD-10-CM

## 2024-08-04 DIAGNOSIS — Z860100 Personal history of colon polyps, unspecified: Secondary | ICD-10-CM

## 2024-08-04 MED ORDER — NA SULFATE-K SULFATE-MG SULF 17.5-3.13-1.6 GM/177ML PO SOLN: 1.0000 | Freq: Once | ORAL | 0 refills | Status: AC

## 2024-08-04 NOTE — Telephone Encounter (Signed)
 Attempt to reach pt for pre-visit. LM with call back #.   Will attempt other number in profile  Second attempt to reach pt  f

## 2024-08-04 NOTE — Progress Notes (Signed)
 Pt's name and DOB verified at the beginning of the pre-visit with 2 identifiers  Pt denies any difficulty with ambulating,sitting, laying down or rolling side to side  Pt has no issues moving head neck or swallowing  No egg or soy allergy known to patient   No issues known to pt with past sedation  No FH of Malignant Hyperthermia  Pt is not on home 02   Pt is not on blood thinners   Pt denies issues with constipation   Pt is not on dialysis Has tachycardia  Pt denies any upcoming cardiac testing  Patient's chart reviewed by Norleen Schillings CNRA prior to pre-visit and patient appropriate for the [facility].  Pre-visit completed and red dot placed by patient's name on their procedure day (on provider's schedule).    Visit by phone  Pt states weight is 250 lb   Pt given  both [facility] main # and MD on call # prior to instructions.  Informed pt to come in at the time discussed and is shown on PV instructions.  Pt instructed to use Single[employer].com or GoodRx for a price reduction on prep  Instructed pt where to find PV instructions in My Ch. Copy of instructions  to be sent in mail and address read back to pt to verify correct on envelope. Instructed pt on all aspects of written instructions including med holds clothing to wear and foods to eat and not eat as well as after procedure legal restrictions and to call MD on call if needed.. Pt states understanding. Instructed pt to review instructions again prior to procedure and call main # given if has any questions or any issues. Pt states they will.

## 2024-08-16 ENCOUNTER — Encounter: Payer: Self-pay | Admitting: Internal Medicine

## 2024-08-18 ENCOUNTER — Encounter: Admitting: Internal Medicine

## 2024-08-21 ENCOUNTER — Encounter: Admitting: Internal Medicine

## 2024-08-23 ENCOUNTER — Ambulatory Visit (AMBULATORY_SURGERY_CENTER): Admitting: Internal Medicine

## 2024-08-23 ENCOUNTER — Encounter: Payer: Self-pay | Admitting: Internal Medicine

## 2024-08-23 VITALS — BP 107/67 | HR 84 | Temp 97.5°F | Resp 15 | Ht 69.0 in | Wt 250.0 lb

## 2024-08-23 DIAGNOSIS — Z860101 Personal history of adenomatous and serrated colon polyps: Secondary | ICD-10-CM

## 2024-08-23 DIAGNOSIS — D128 Benign neoplasm of rectum: Secondary | ICD-10-CM

## 2024-08-23 DIAGNOSIS — Z860100 Personal history of colon polyps, unspecified: Secondary | ICD-10-CM

## 2024-08-23 DIAGNOSIS — Z1211 Encounter for screening for malignant neoplasm of colon: Secondary | ICD-10-CM | POA: Diagnosis present

## 2024-08-23 DIAGNOSIS — K5730 Diverticulosis of large intestine without perforation or abscess without bleeding: Secondary | ICD-10-CM

## 2024-08-23 DIAGNOSIS — K621 Rectal polyp: Secondary | ICD-10-CM

## 2024-08-23 DIAGNOSIS — Z800 Family history of malignant neoplasm of digestive organs: Secondary | ICD-10-CM

## 2024-08-23 MED ORDER — SODIUM CHLORIDE 0.9 % IV SOLN: 500.0000 mL | INTRAVENOUS | Status: DC

## 2024-08-23 NOTE — Progress Notes (Signed)
 Called to room to assist during endoscopic procedure.  Patient ID and intended procedure confirmed with present staff. Received instructions for my participation in the procedure from the performing physician.

## 2024-08-23 NOTE — Patient Instructions (Signed)
 Resume previous diet.  Continue present medications.  Awaiting pathology results. Repeat colonoscopy in 5 years for surveillance.  Handouts provided on polyps and diverticulosis.  YOU HAD AN ENDOSCOPIC PROCEDURE TODAY AT THE [facility]:   Refer to the procedure report that was given to you for any specific questions about what was found during the examination.  If the procedure report does not answer your questions, please call your gastroenterologist to clarify.  If you requested that your care partner not be given the details of your procedure findings, then the procedure report has been included in a sealed envelope for you to review at your convenience later.  YOU SHOULD EXPECT: Some feelings of bloating in the abdomen. Passage of more gas than usual.  Walking can help get rid of the air that was put into your GI tract during the procedure and reduce the bloating. If you had a lower endoscopy (such as a colonoscopy or flexible sigmoidoscopy) you may notice spotting of blood in your stool or on the toilet paper. If you underwent a bowel prep for your procedure, you may not have a normal bowel movement for a few days.  Please Note:  You might notice some irritation and congestion in your nose or some drainage.  This is from the oxygen used during your procedure.  There is no need for concern and it should clear up in a day or so.  SYMPTOMS TO REPORT IMMEDIATELY:  Following lower endoscopy (colonoscopy or flexible sigmoidoscopy):  Excessive amounts of blood in the stool  Significant tenderness or worsening of abdominal pains  Swelling of the abdomen that is new, acute  Fever of 100F or higher  For urgent or emergent issues, a gastroenterologist can be reached at any hour by calling [phone]. Do not use MyChart messaging for urgent concerns.    DIET:  We do recommend a small meal at first, but then you may proceed to your regular diet.  Drink plenty of fluids but you  should avoid alcoholic beverages for 24 hours.  ACTIVITY:  You should plan to take it easy for the rest of today and you should NOT DRIVE or use heavy machinery until tomorrow (because of the sedation medicines used during the test).    FOLLOW UP: Our staff will call the number listed on your records the next business day following your procedure.  We will call around 7:15- 8:00 am to check on you and address any questions or concerns that you may have regarding the information given to you following your procedure. If we do not reach you, we will leave a message.     If any biopsies were taken you will be contacted by phone or by letter within the next 1-3 weeks.  Please call us  at [phone] if you have not heard about the biopsies in 3 weeks.    SIGNATURES/CONFIDENTIALITY: You and/or your care partner have signed paperwork which will be entered into your electronic medical record.  These signatures attest to the fact that that the information above on your After Visit Summary has been reviewed and is understood.  Full responsibility of the confidentiality of this discharge information lies with you and/or your care-partner.

## 2024-08-23 NOTE — Progress Notes (Signed)
 GASTROENTEROLOGY PROCEDURE H&P NOTE   Primary Care Physician: Garald Karlynn GAILS, MD    Reason for Procedure:  History of multiple adenomatous polyps  Plan:    Colonoscopy  Patient is appropriate for endoscopic procedure(s) in the ambulatory ([facility]) setting.  The nature of the procedure, as well as the risks, benefits, and alternatives were carefully and thoroughly reviewed with the patient. Ample time for discussion and questions allowed. The patient understood, was satisfied, and agreed to proceed.     HPI: Jon King is a 58 y.o. male who presents for surveillance colonoscopy.  Medical history as below.  Tolerated the prep.  No recent chest pain or shortness of breath.  No abdominal pain today.  Past Medical History:  Diagnosis Date   Allergy    Cancer (HCC)    skin   Degenerative disc disease, cervical    Diverticulosis    Family history of breast cancer    Family history of colon cancer    Family history of colonic polyps    Family history of gene mutation    Family history of lung cancer    Headache(784.0)    orginate from nerve thing in head   History of kidney stones    last stone nov 2015   Hx of adenomatous colonic polyps    Hypertension    Internal hemorrhoids    Internal hemorrhoids    Skin rash    right should road rash healing no drainage   Sleep apnea    pending home test   Tachycardia     Past Surgical History:  Procedure Laterality Date   COLONOSCOPY  2010, 2011,05/13/15   COLONOSCOPY WITH PROPOFOL  N/A 05/13/2015   Procedure: COLONOSCOPY WITH PROPOFOL ;  Surgeon: Gladis MARLA Louder, MD;  Location: [facility];  Service: Endoscopy;  Laterality: N/A;   LITHOTRIPSY  2014    Prior to Admission medications   Medication Sig Start Date End Date Taking? Authorizing Provider  Cholecalciferol (VITAMIN D3) 50 MCG (2000 UT) capsule Take 2,000 Units by mouth daily.   Yes [provider]  gabapentin  (NEURONTIN ) 300 MG capsule Take 1 capsule  (300 mg total) by mouth at bedtime. 11/03/23  Yes Plotnikov, Karlynn GAILS, MD  icosapent  Ethyl (VASCEPA ) 1 g capsule Take 2 capsules (2 g total) by mouth 2 (two) times daily. 07/11/24  Yes Plotnikov, Karlynn GAILS, MD  metoprolol  succinate (TOPROL -XL) 25 MG 24 hr tablet Take 1 tablet (25 mg total) by mouth daily. 07/05/24  Yes Plotnikov, Aleksei V, MD  Multiple Vitamin (MULTIVITAMIN) tablet Take 1 tablet by mouth daily.   Yes [provider]  Omega-3 Fatty Acids (FISH OIL ) 600 MG CAPS 1capsule bid 10/08/21  Yes Plotnikov, Aleksei V, MD  Albuterol-Budesonide (AIRSUPRA ) 90-80 MCG/ACT AERO Inhale 2 Inhalations into the lungs every 4 (four) hours as needed. Patient not taking: No sig reported 11/03/23   Plotnikov, Karlynn GAILS, MD  diltiazem  2 % GEL Diltiazem  2% gel Apply a pea sized amount internally two times daily as needed Dispense 30 GM zero refill 02/10/21   Laveta Gilkey, Gordy HERO, MD  indomethacin  (INDOCIN ) 50 MG capsule Take 1 capsule (50 mg total) by mouth 3 (three) times daily with meals. Patient not taking: Reported on 08/04/2024 07/05/24   Plotnikov, Aleksei V, MD  Olmesartan -amLODIPine -HCTZ (TRIBENZOR) 40-5-12.5 MG TABS Take 1 tablet by mouth 1 day or 1 dose. Patient not taking: Reported on 08/23/2024 05/24/24   Plotnikov, Karlynn GAILS, MD  phentermine  (ADIPEX-P ) 37.5 MG tablet Take 1 tablet (  37.5 mg total) by mouth daily before breakfast. Patient not taking: Reported on 08/04/2024 07/05/24   Plotnikov, Aleksei V, MD    Current Outpatient Medications  Medication Sig Dispense Refill   Cholecalciferol (VITAMIN D3) 50 MCG (2000 UT) capsule Take 2,000 Units by mouth daily.     gabapentin  (NEURONTIN ) 300 MG capsule Take 1 capsule (300 mg total) by mouth at bedtime. 90 capsule 3   icosapent  Ethyl (VASCEPA ) 1 g capsule Take 2 capsules (2 g total) by mouth 2 (two) times daily. 120 capsule 11   metoprolol  succinate (TOPROL -XL) 25 MG 24 hr tablet Take 1 tablet (25 mg total) by mouth daily. 90 tablet 3   Multiple  Vitamin (MULTIVITAMIN) tablet Take 1 tablet by mouth daily.     Omega-3 Fatty Acids (FISH OIL ) 600 MG CAPS 1capsule bid 60 capsule 11   Albuterol-Budesonide (AIRSUPRA ) 90-80 MCG/ACT AERO Inhale 2 Inhalations into the lungs every 4 (four) hours as needed. (Patient not taking: No sig reported) 10 g 5   diltiazem  2 % GEL Diltiazem  2% gel Apply a pea sized amount internally two times daily as needed Dispense 30 GM zero refill 30 g 0   indomethacin  (INDOCIN ) 50 MG capsule Take 1 capsule (50 mg total) by mouth 3 (three) times daily with meals. (Patient not taking: Reported on 08/04/2024) 90 capsule 2   Olmesartan -amLODIPine -HCTZ (TRIBENZOR) 40-5-12.5 MG TABS Take 1 tablet by mouth 1 day or 1 dose. (Patient not taking: Reported on 08/23/2024) 30 tablet 11   phentermine  (ADIPEX-P ) 37.5 MG tablet Take 1 tablet (37.5 mg total) by mouth daily before breakfast. (Patient not taking: Reported on 08/04/2024) 30 tablet 2   Current Facility-Administered Medications  Medication Dose Route Frequency Provider Last Rate Last Admin   0.9 %  sodium chloride  infusion  500 mL Intravenous Continuous Jeray Shugart, Gordy HERO, MD        Allergies as of 08/23/2024 - Review Complete 08/23/2024  Allergen Reaction Noted   Lisinopril  Cough 11/03/2023    Family History  Problem Relation Age of Onset   Lung cancer Father 14   Lung cancer Sister 21   Colon polyps Sister        more than 10   Breast cancer Mother 12   Asthma Paternal Grandfather    Breast cancer Other        diagnosed in her early 10s   Cancer Paternal Uncle        diagnosed younger than 17, unknown type (3 paternal uncles)   Cancer Cousin        diagnosed younger than 62, unknown type (paternal cousin)   Colon cancer Other        mother's cousin; positive genetic testing in a cancer gene   Esophageal cancer Neg Hx    Rectal cancer Neg Hx    Stomach cancer Neg Hx     Social History   Socioeconomic History   Marital status: Married    Spouse name:  Jon King   Number of children: 2   Years of education: 12   Highest education level: Not on file  Occupational History    Employer: [employer]    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  Tobacco Use   Smoking status: Former    Current packs/day: 0.00    Average packs/day: 1 pack/day for 20.0 years (20.0 ttl pk-yrs)    Types: Cigarettes    Start date: 09/11/1981    Quit date: 09/11/2001    Years since quitting: 22.9   Smokeless  tobacco: Never  Vaping Use   Vaping status: Never Used  Substance and Sexual Activity   Alcohol use: Yes    Comment: rare   Drug use: No   Sexual activity: Yes  Other Topics Concern   Not on file  Social History Narrative   Patient lives at home with his wife [place]). Patient works full time. 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 Patient has high school education.   Right handed.   Caffeine- sodas - two daily.   Social Drivers of Corporate Investment Banker Strain: Not on file  Food Insecurity: Not on file  Transportation Needs: Not on file  Physical Activity: Not on file  Stress: Not on file  Social Connections: Not on file  Intimate Partner Violence: Not on file    Physical Exam: Vital signs in last 24 hours: @BP  133/87   Pulse (!) 103   Temp (!) 97.5 F (36.4 C)   Ht 5' 9 (1.753 m)   Wt 250 lb (113.4 kg)   SpO2 95%   BMI 36.92 kg/m  GEN: NAD EYE: Sclerae anicteric ENT: MMM CV: Non-tachycardic Pulm: CTA b/l GI: Soft, NT/ND NEURO:  Alert & Oriented x 3   Gordy Starch, MD [facility]  08/23/2024 9:43 AM

## 2024-08-23 NOTE — Progress Notes (Signed)
 Report to PACU, RN, vss, BBS= Clear.

## 2024-08-23 NOTE — Progress Notes (Signed)
 Pt's states no medical or surgical changes since previsit or office visit.

## 2024-08-23 NOTE — Op Note (Signed)
 [facility] Patient Name: Jon King Procedure Date: 08/23/2024 9:47 AM MRN: [id number] Endoscopist: Gordy CHRISTELLA Starch , MD, [id number] Age: 58 Referring MD:  Date of Birth: 12-11-1965 Gender: Male Account #: [id number] Procedure:                Colonoscopy Indications:              High risk colon cancer surveillance: Personal                            history of multiple adenomas, Last colonoscopy:                            June 2022 (TA x 3), April 2021 (TA x 10) Medicines:                Monitored Anesthesia Care Procedure:                Pre-Anesthesia Assessment:                           - Prior to the procedure, a History and Physical                            was performed, and patient medications and                            allergies were reviewed. The patient's tolerance of                            previous anesthesia was also reviewed. The risks                            and benefits of the procedure and the sedation                            options and risks were discussed with the patient.                            All questions were answered, and informed consent                            was obtained. Prior Anticoagulants: The patient has                            taken no anticoagulant or antiplatelet agents. ASA                            Grade Assessment: II - A patient with mild systemic                            disease. After reviewing the risks and benefits,                            the patient was deemed in satisfactory condition to  undergo the procedure.                           After obtaining informed consent, the colonoscope                            was passed under direct vision. Throughout the                            procedure, the patient's blood pressure, pulse, and                            oxygen saturations were monitored continuously. The                            CF [id number] #[id number] was  introduced through the anus                            and advanced to the cecum, identified by                            appendiceal orifice and ileocecal valve. The                            colonoscopy was performed without difficulty. The                            patient tolerated the procedure well. The quality                            of the bowel preparation was good. The ileocecal                            valve, appendiceal orifice, and rectum were                            photographed. Scope In: 9:51:40 AM Scope Out: 10:02:43 AM Scope Withdrawal Time: 0 hours 9 minutes 28 seconds  Total Procedure Duration: 0 hours 11 minutes 3 seconds  Findings:                 The digital rectal exam was normal.                           A 4 mm polyp was found in the rectum. The polyp was                            sessile. The polyp was removed with a cold snare.                            Resection and retrieval were complete.                           A few small-mouthed diverticula were found in the  sigmoid colon.                           The retroflexed view of the distal rectum and anal                            verge was normal and showed no anal or rectal                            abnormalities. Complications:            No immediate complications. Estimated Blood Loss:     Estimated blood loss: none. Impression:               - One 4 mm polyp in the rectum, removed with a cold                            snare. Resected and retrieved.                           - Mild diverticulosis in the sigmoid colon.                           - The distal rectum and anal verge are normal on                            retroflexion view. Recommendation:           - Patient has a contact number available for                            emergencies. The signs and symptoms of potential                            delayed complications were discussed with the                             patient. Return to normal activities tomorrow.                            Written discharge instructions were provided to the                            patient.                           - Resume previous diet.                           - Continue present medications.                           - Await pathology results.                           - Repeat colonoscopy in 5 years for surveillance. Gordy CHRISTELLA Starch, MD 08/23/2024 10:06:21 AM This report has been  signed electronically.

## 2024-08-24 ENCOUNTER — Telehealth: Payer: Self-pay

## 2024-08-24 NOTE — Telephone Encounter (Signed)
  Follow up Call-     08/23/2024    8:45 AM  Call back number  Post procedure Call Back phone  # [phone]  Permission to leave phone message Yes     Patient questions:  Do you have a fever, pain , or abdominal swelling? No. Pain Score  0 *  Have you tolerated food without any problems? Yes  Have you been able to return to your normal activities? Yes.    Do you have any questions about your discharge instructions: Diet   No. Medications  No. Follow up visit  No.  Do you have questions or concerns about your Care? No.  Actions: * If pain score is 4 or above: No action needed, pain <4.

## 2024-08-25 ENCOUNTER — Ambulatory Visit: Payer: Self-pay | Admitting: Internal Medicine

## 2024-08-25 LAB — SURGICAL PATHOLOGY

## 2024-11-13 LAB — OPHTHALMOLOGY REPORT-SCANNED

## 2024-11-14 ENCOUNTER — Other Ambulatory Visit: Payer: Self-pay | Admitting: Internal Medicine

## 2025-01-02 ENCOUNTER — Ambulatory Visit: Admitting: Internal Medicine

## 2025-07-05 ENCOUNTER — Ambulatory Visit: Admitting: Internal Medicine
